# Patient Record
Sex: Female | Born: 1975 | Race: White | Hispanic: No | Marital: Married | Smoking: Former smoker
Health system: Southern US, Community
[De-identification: ages and names within clinical notes are randomized; demographics above are authoritative.]

## PROBLEM LIST (undated history)

## (undated) DIAGNOSIS — I10 Essential (primary) hypertension: Secondary | ICD-10-CM

## (undated) DIAGNOSIS — K2100 Gastro-esophageal reflux disease with esophagitis, without bleeding: Secondary | ICD-10-CM

## (undated) DIAGNOSIS — E039 Hypothyroidism, unspecified: Secondary | ICD-10-CM

## (undated) DIAGNOSIS — F32A Depression, unspecified: Secondary | ICD-10-CM

## (undated) DIAGNOSIS — J45909 Unspecified asthma, uncomplicated: Secondary | ICD-10-CM

## (undated) DIAGNOSIS — F419 Anxiety disorder, unspecified: Secondary | ICD-10-CM

## (undated) DIAGNOSIS — E785 Hyperlipidemia, unspecified: Secondary | ICD-10-CM

## (undated) DIAGNOSIS — G43909 Migraine, unspecified, not intractable, without status migrainosus: Secondary | ICD-10-CM

## (undated) HISTORY — DX: Anxiety disorder, unspecified: F41.9

## (undated) HISTORY — PX: CHOLECYSTECTOMY: SHX55

## (undated) HISTORY — DX: Essential (primary) hypertension: I10

## (undated) HISTORY — PX: BREAST EXCISIONAL BIOPSY: SUR124

## (undated) HISTORY — PX: VAGINAL HYSTERECTOMY: SHX2639

## (undated) HISTORY — DX: Gastro-esophageal reflux disease with esophagitis, without bleeding: K21.00

## (undated) HISTORY — PX: LAPAROSCOPIC GASTRIC SLEEVE RESECTION: SHX5895

## (undated) HISTORY — DX: Hyperlipidemia, unspecified: E78.5

## (undated) HISTORY — DX: Hypothyroidism, unspecified: E03.9

## (undated) HISTORY — DX: Depression, unspecified: F32.A

---
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
## 2017-12-08 DIAGNOSIS — F418 Other specified anxiety disorders: Secondary | ICD-10-CM | POA: Insufficient documentation

## 2018-07-21 DIAGNOSIS — F321 Major depressive disorder, single episode, moderate: Secondary | ICD-10-CM | POA: Insufficient documentation

## 2020-02-03 ENCOUNTER — Other Ambulatory Visit: Payer: Self-pay | Admitting: Legal Medicine

## 2020-02-03 DIAGNOSIS — G43709 Chronic migraine without aura, not intractable, without status migrainosus: Secondary | ICD-10-CM

## 2020-02-07 ENCOUNTER — Encounter: Payer: Self-pay | Admitting: Legal Medicine

## 2020-02-07 DIAGNOSIS — E782 Mixed hyperlipidemia: Secondary | ICD-10-CM | POA: Insufficient documentation

## 2020-02-07 DIAGNOSIS — K2100 Gastro-esophageal reflux disease with esophagitis, without bleeding: Secondary | ICD-10-CM | POA: Insufficient documentation

## 2020-02-07 DIAGNOSIS — J4520 Mild intermittent asthma, uncomplicated: Secondary | ICD-10-CM | POA: Insufficient documentation

## 2020-02-07 DIAGNOSIS — G43109 Migraine with aura, not intractable, without status migrainosus: Secondary | ICD-10-CM | POA: Insufficient documentation

## 2020-02-07 DIAGNOSIS — Z9884 Bariatric surgery status: Secondary | ICD-10-CM | POA: Insufficient documentation

## 2020-02-07 DIAGNOSIS — E039 Hypothyroidism, unspecified: Secondary | ICD-10-CM | POA: Insufficient documentation

## 2020-02-07 DIAGNOSIS — I10 Essential (primary) hypertension: Secondary | ICD-10-CM | POA: Insufficient documentation

## 2020-02-08 ENCOUNTER — Ambulatory Visit: Payer: Self-pay | Admitting: Legal Medicine

## 2020-02-14 ENCOUNTER — Other Ambulatory Visit: Payer: Self-pay

## 2020-02-14 ENCOUNTER — Other Ambulatory Visit: Payer: Self-pay | Admitting: Legal Medicine

## 2020-02-14 ENCOUNTER — Encounter: Payer: Self-pay | Admitting: Legal Medicine

## 2020-02-14 ENCOUNTER — Ambulatory Visit (INDEPENDENT_AMBULATORY_CARE_PROVIDER_SITE_OTHER): Payer: Managed Care, Other (non HMO) | Admitting: Legal Medicine

## 2020-02-14 VITALS — BP 140/90 | HR 77 | Temp 97.8°F | Resp 18 | Ht 65.0 in | Wt 353.0 lb

## 2020-02-14 DIAGNOSIS — E782 Mixed hyperlipidemia: Secondary | ICD-10-CM | POA: Diagnosis not present

## 2020-02-14 DIAGNOSIS — E038 Other specified hypothyroidism: Secondary | ICD-10-CM

## 2020-02-14 DIAGNOSIS — K2100 Gastro-esophageal reflux disease with esophagitis, without bleeding: Secondary | ICD-10-CM

## 2020-02-14 DIAGNOSIS — G43709 Chronic migraine without aura, not intractable, without status migrainosus: Secondary | ICD-10-CM

## 2020-02-14 DIAGNOSIS — I10 Essential (primary) hypertension: Secondary | ICD-10-CM | POA: Diagnosis not present

## 2020-02-14 MED ORDER — KETOROLAC TROMETHAMINE 10 MG PO TABS: 10.0000 mg | ORAL_TABLET | Freq: Four times a day (QID) | ORAL | 3 refills | Status: DC | PRN

## 2020-02-14 MED ORDER — RIZATRIPTAN BENZOATE 10 MG PO TABS: 10.0000 mg | ORAL_TABLET | ORAL | 2 refills | Status: DC | PRN

## 2020-02-14 NOTE — Assessment & Plan Note (Signed)

## 2020-02-14 NOTE — Assessment & Plan Note (Signed)
AN INDIVIDUAL CARE PLAN was established and reinforced today.  The patient's status was assessed using clinical findings on exam, lab and other diagnostic tests. The patient's disease status was assessed based on evidence-based guidelines and found to be in good controlled. MEDICATIONS were reviewed. SELF MANAGEMENT GOALS have been discussed and patient's success at attaining the goal of low cholesterol was assessed. RECOMMENDATION given include regular exercise 3 days a week and low cholesterol/low fat diet. CLINICAL SUMMARY including written plan to identify barriers unique to the patient due to social or economic  reasons was discussed.

## 2020-02-14 NOTE — Assessment & Plan Note (Signed)
AN INDIVIDUAL CARE PLAN was established and reinforced today.  The patient's status was assessed using clinical findings on exam, labs, and other diagnostic testing. Patient's success at meeting treatment goals based on disease specific evidence-bassed guidelines and found to be in good control. RECOMMENDATIONS include maintaining present medicines and treatment. 

## 2020-02-14 NOTE — Assessment & Plan Note (Signed)
An individualize plan was formulated using patient history and physical exam to encourage weight loss.  An evidence based program was formulated.  Patient is to cut portion size with meals and to plan physical exercise 3 days a week at least 20 minutes.  Weight watchers and other programs are helpful.  Planned amount of weight loss 10 lbs. By next visit

## 2020-02-14 NOTE — Progress Notes (Signed)
Established Patient Office Visit  Subjective:  Patient ID: Erin Henderson, female    DOB: 1976-10-25  Age: 44 y.o. MRN: [id number]  CC:  Chief Complaint  Patient presents with  . Hypothyroidism  . Hyperlipidemia  . Hypertension  . Gastroesophageal Reflux    HPI Erin Henderson presents for Chronic visit.  Patient has hypothyroidism and is n levothyroxine 16mg.  No problems.  Patient presents for follow up of hypertension.  Patient tolerating losartan, propranolol well with side effects.  Patient was diagnosed with hypertension 2010 so has been treated for hypertension for 10 years.Patient is working on maintaining diet and exercise regimen and follows up as directed. Complication include none.  Patient presents with hyperlipidemia.  Compliance with treatment has been good; patient takes medicines as directed, maintains low cholesterol diet, follows up as directed, and maintains exercise regimen.  Patient is using pravastatin without problems.  Patient has gastroesophageal reflux symptoms withesophagitis and LTRD.  The symptoms are moderate intensity.  Length of symptoms 10 years.  Medicines include omeprazole.  Complications include none.  Past Medical History:  Diagnosis Date  . Asthma   . Hyperlipidemia   . Hypertension   . Hypothyroidism   . Migraines     Past Surgical History:  Procedure Laterality Date  . CHOLECYSTECTOMY    . LAPAROSCOPIC GASTRIC SLEEVE RESECTION    . VAGINAL HYSTERECTOMY      Family History  Problem Relation Age of Onset  . Hypertension Mother   . Hyperlipidemia Mother   . Thyroid disease Mother   . Migraines Mother   . Asthma Father   . Stroke Father   . Diabetes Father   . Hyperlipidemia Father   . Hypertension Father   . Thyroid disease Father   . Hyperlipidemia Sister   . Hypertension Sister     Social History   Socioeconomic History  . Marital status: Married    Spouse name: Not on file  . Number of children: 2  . Years of  education: Not on file  . Highest education level: Not on file  Occupational History  . Occupation: school bus driver  Tobacco Use  . Smoking status: Former Smoker    Types: Cigarettes    Quit date: 2000    Years since quitting: 21.1  . Smokeless tobacco: Never Used  Substance and Sexual Activity  . Alcohol use: Never  . Drug use: Never  . Sexual activity: Not on file  Other Topics Concern  . Not on file  Social History Narrative  . Not on file   Social Determinants of Health   Financial Resource Strain:   . Difficulty of Paying Living Expenses: Not on file  Food Insecurity:   . Worried About RCharity fundraiserin the Last Year: Not on file  . Ran Out of Food in the Last Year: Not on file  Transportation Needs:   . Lack of Transportation (Medical): Not on file  . Lack of Transportation (Non-Medical): Not on file  Physical Activity:   . Days of Exercise per Week: Not on file  . Minutes of Exercise per Session: Not on file  Stress:   . Feeling of Stress : Not on file  Social Connections:   . Frequency of Communication with Friends and Family: Not on file  . Frequency of Social Gatherings with Friends and Family: Not on file  . Attends Religious Services: Not on file  . Active Member of Clubs or Organizations: Not on file  .  Attends Archivist Meetings: Not on file  . Marital Status: Not on file  Intimate Partner Violence:   . Fear of Current or Ex-Partner: Not on file  . Emotionally Abused: Not on file  . Physically Abused: Not on file  . Sexually Abused: Not on file    Outpatient Medications Prior to Visit  Medication Sig Dispense Refill  . ARIPiprazole (ABILIFY) 10 MG tablet Take 10 mg by mouth daily.    Marland Kitchen levothyroxine (SYNTHROID) 100 MCG tablet Take 100 mcg by mouth daily before breakfast.    . losartan (COZAAR) 100 MG tablet Take 100 mg by mouth daily.    Marland Kitchen omeprazole (PRILOSEC) 40 MG capsule Take 40 mg by mouth daily.    . OXcarbazepine (TRILEPTAL)  150 MG tablet Take 300 mg by mouth 2 (two) times daily.    . pravastatin (PRAVACHOL) 80 MG tablet Take 80 mg by mouth daily.    . propranolol (INDERAL) 80 MG tablet Take 80 mg by mouth 3 (three) times daily.    . sertraline (ZOLOFT) 100 MG tablet Take 100 mg by mouth daily.    Marland Kitchen ketorolac (TORADOL) 10 MG tablet Take 10 mg by mouth every 6 (six) hours as needed.    . rizatriptan (MAXALT) 10 MG tablet Take 1 tablet (10 mg total) by mouth as needed for migraine. 30 tablet 2  . ketorolac (TORADOL) 10 MG tablet Take 1 tablet (10 mg total) by mouth every 8 (eight) hours. 30 tablet 2  . rizatriptan (MAXALT) 10 MG tablet Take 10 mg by mouth as needed. May repeat in 2 hours if needed     No facility-administered medications prior to visit.    Allergies  Allergen Reactions  . Cefdinir Hives and Nausea And Vomiting  . Risperidone Other (See Comments)    unknown    ROS Review of Systems  Constitutional: Negative.   HENT: Negative.   Eyes: Negative.   Respiratory: Negative.   Cardiovascular: Negative.   Gastrointestinal: Negative.   Endocrine: Negative.   Genitourinary: Negative.   Musculoskeletal: Negative.   Skin: Negative.   Neurological: Negative.       Objective:    Physical Exam  Constitutional: She is oriented to person, place, and time. She appears well-developed and well-nourished.  HENT:  Head: Normocephalic and atraumatic.  Eyes: Pupils are equal, round, and reactive to light. Conjunctivae and EOM are normal.  Cardiovascular: Normal heart sounds.  Pulmonary/Chest: Effort normal and breath sounds normal.  Abdominal: Soft. Bowel sounds are normal.  Musculoskeletal:        General: Normal range of motion.     Cervical back: Normal range of motion and neck supple.  Neurological: She is alert and oriented to person, place, and time. She has normal reflexes.  Skin: Skin is warm and dry.  Psychiatric: She has a normal mood and affect.  Vitals reviewed.   BP 140/90    Pulse 77   Temp 97.8 F (36.6 C)   Resp 18   Ht 5' 5"  (1.651 m)   Wt (!) 353 lb (160.1 kg)   SpO2 96%   BMI 58.74 kg/m  Wt Readings from Last 3 Encounters:  02/14/20 (!) 353 lb (160.1 kg)     Health Maintenance Due  Topic Date Due  . HIV Screening  09/23/1989  . TETANUS/TDAP  09/23/1993  . PAP SMEAR-Modifier  09/24/1995  . INFLUENZA VACCINE  07/22/2019    There are no preventive care reminders to display for this patient.  No results found for: TSH No results found for: WBC, HGB, HCT, MCV, PLT No results found for: NA, K, CHLORIDE, CO2, GLUCOSE, BUN, CREATININE, BILITOT, ALKPHOS, AST, ALT, PROT, ALBUMIN, CALCIUM, ANIONGAP, EGFR, GFR No results found for: CHOL No results found for: HDL No results found for: LDLCALC No results found for: TRIG No results found for: CHOLHDL No results found for: HGBA1C    Assessment & Plan:   Problem List Items Addressed This Visit      Cardiovascular and Mediastinum   Essential hypertension    An individual care plan was established and reinforced today.  The patient's status was assessed using clinical findings on exam and labs or diagnostic tests. The patient's success at meeting treatment goals on disease specific evidence-based guidelines and found to be well controlled. SELF MANAGEMENT: The patient and I together assessed ways to personally work towards obtaining the recommended goals. RECOMMENDATIONS: avoid decongestants found in common cold remedies, decrease consumption of alcohol, perform routine monitoring of BP with home BP cuff, exercise, reduction of dietary salt, take medicines as prescribed, try not to miss doses and quit smoking.  Regular exercise and maintaining a healthy weight is needed.  Stress reduction may help. A CLINICAL SUMMARY including written plan identify barriers to care unique to individual due to social or financial issues.  We attempt to mutually creat solutions for individual and family understanding.       Relevant Orders   CBC with Differential   Comprehensive metabolic panel     Digestive   GERD with esophagitis    AN INDIVIDUAL CARE PLAN was established and reinforced today.  The patient's status was assessed using clinical findings on exam, labs, and other diagnostic testing. Patient's success at meeting treatment goals based on disease specific evidence-bassed guidelines and found to be in good control. RECOMMENDATIONS include maintaining present medicines and treatment.        Endocrine   Hypothyroidism - Primary    AN INDIVIDUAL CARE PLAN was established and reinforced today.  The patient's status was assessed using clinical findings on exam, labs, and other diagnostic testing. Patient's success at meeting treatment goals based on disease specific evidence-bassed guidelines and found to be in good control. RECOMMENDATIONS include maintaining present medicines and treatment.      Relevant Orders   TSH     Other   Mixed hyperlipidemia    AN INDIVIDUAL CARE PLAN was established and reinforced today.  The patient's status was assessed using clinical findings on exam, lab and other diagnostic tests. The patient's disease status was assessed based on evidence-based guidelines and found to be in good controlled. MEDICATIONS were reviewed. SELF MANAGEMENT GOALS have been discussed and patient's success at attaining the goal of low cholesterol was assessed. RECOMMENDATION given include regular exercise 3 days a week and low cholesterol/low fat diet. CLINICAL SUMMARY including written plan to identify barriers unique to the patient due to social or economic  reasons was discussed.      Relevant Orders   Lipid Panel   Morbid obesity ([place])    An individualize plan was formulated using patient history and physical exam to encourage weight loss.  An evidence based program was formulated.  Patient is to cut portion size with meals and to plan physical exercise 3 days a week at least 20 minutes.   Weight watchers and other programs are helpful.  Planned amount of weight loss 10 lbs. By next visit       Other Visit Diagnoses  Chronic migraine without aura without status migrainosus, not intractable       Relevant Medications   ketorolac (TORADOL) 10 MG tablet      Meds ordered this encounter  Medications  . DISCONTD: rizatriptan (MAXALT) 10 MG tablet    Sig: Take 1 tablet (10 mg total) by mouth as needed for migraine.    Dispense:  30 tablet    Refill:  2  . ketorolac (TORADOL) 10 MG tablet    Sig: Take 1 tablet (10 mg total) by mouth every 6 (six) hours as needed.    Dispense:  20 tablet    Refill:  3    Follow-up: Return in about 4 months (around 06/13/2020) for fasting.    Reinaldo Meeker, MD

## 2020-02-15 LAB — COMPREHENSIVE METABOLIC PANEL
ALT: 39 IU/L — ABNORMAL HIGH (ref 0–32)
AST: 22 IU/L (ref 0–40)
Albumin/Globulin Ratio: 2 (ref 1.2–2.2)
Albumin: 4.3 g/dL (ref 3.8–4.8)
Alkaline Phosphatase: 149 IU/L — ABNORMAL HIGH (ref 39–117)
BUN/Creatinine Ratio: 13 (ref 9–23)
BUN: 10 mg/dL (ref 6–24)
Bilirubin Total: 0.3 mg/dL (ref 0.0–1.2)
CO2: 24 mmol/L (ref 20–29)
Calcium: 9.1 mg/dL (ref 8.7–10.2)
Chloride: 104 mmol/L (ref 96–106)
Creatinine, Ser: 0.79 mg/dL (ref 0.57–1.00)
GFR calc Af Amer: 105 mL/min/{1.73_m2} (ref >59)
GFR calc non Af Amer: 91 mL/min/{1.73_m2} (ref >59)
Globulin, Total: 2.1 g/dL (ref 1.5–4.5)
Glucose: 85 mg/dL (ref 65–99)
Potassium: 3.9 mmol/L (ref 3.5–5.2)
Sodium: 143 mmol/L (ref 134–144)
Total Protein: 6.4 g/dL (ref 6.0–8.5)

## 2020-02-15 LAB — TSH: TSH: 1.49 u[IU]/mL (ref 0.450–4.500)

## 2020-02-15 LAB — CBC WITH DIFFERENTIAL/PLATELET
Basophils Absolute: 0 10*3/uL (ref 0.0–0.2)
Basos: 0 %
EOS (ABSOLUTE): 0.1 10*3/uL (ref 0.0–0.4)
Eos: 2 %
Hematocrit: 38 % (ref 34.0–46.6)
Hemoglobin: 12.6 g/dL (ref 11.1–15.9)
Immature Grans (Abs): 0 10*3/uL (ref 0.0–0.1)
Immature Granulocytes: 0 %
Lymphocytes Absolute: 2.9 10*3/uL (ref 0.7–3.1)
Lymphs: 36 %
MCH: 26.3 pg — ABNORMAL LOW (ref 26.6–33.0)
MCHC: 33.2 g/dL (ref 31.5–35.7)
MCV: 79 fL (ref 79–97)
Monocytes Absolute: 0.5 10*3/uL (ref 0.1–0.9)
Monocytes: 6 %
Neutrophils Absolute: 4.6 10*3/uL (ref 1.4–7.0)
Neutrophils: 56 %
Platelets: 216 10*3/uL (ref 150–450)
RBC: 4.79 x10E6/uL (ref 3.77–5.28)
RDW: 14.4 % (ref 11.7–15.4)
WBC: 8.1 10*3/uL (ref 3.4–10.8)

## 2020-02-15 LAB — LIPID PANEL
Chol/HDL Ratio: 5.1 ratio — ABNORMAL HIGH (ref 0.0–4.4)
Cholesterol, Total: 178 mg/dL (ref 100–199)
HDL: 35 mg/dL — ABNORMAL LOW (ref >39)
LDL Chol Calc (NIH): 96 mg/dL (ref 0–99)
Triglycerides: 278 mg/dL — ABNORMAL HIGH (ref 0–149)
VLDL Cholesterol Cal: 47 mg/dL — ABNORMAL HIGH (ref 5–40)

## 2020-02-15 LAB — CARDIOVASCULAR RISK ASSESSMENT

## 2020-02-15 NOTE — Progress Notes (Signed)
No anemia, no kidney problems, liver tests slightly up, Triglycerides high, LDL normal  need to watch diet. lp

## 2020-02-19 ENCOUNTER — Encounter: Payer: Self-pay | Admitting: Legal Medicine

## 2020-03-07 ENCOUNTER — Other Ambulatory Visit: Payer: Self-pay | Admitting: Legal Medicine

## 2020-05-24 ENCOUNTER — Other Ambulatory Visit: Payer: Self-pay | Admitting: Legal Medicine

## 2020-06-01 ENCOUNTER — Other Ambulatory Visit: Payer: Self-pay | Admitting: Legal Medicine

## 2020-07-18 ENCOUNTER — Other Ambulatory Visit: Payer: Self-pay | Admitting: Legal Medicine

## 2020-07-18 DIAGNOSIS — I10 Essential (primary) hypertension: Secondary | ICD-10-CM

## 2020-07-18 DIAGNOSIS — E782 Mixed hyperlipidemia: Secondary | ICD-10-CM

## 2020-08-14 ENCOUNTER — Encounter: Payer: Self-pay | Admitting: Legal Medicine

## 2020-08-14 ENCOUNTER — Ambulatory Visit: Payer: Self-pay | Admitting: Legal Medicine

## 2020-08-14 DIAGNOSIS — E785 Hyperlipidemia, unspecified: Secondary | ICD-10-CM | POA: Insufficient documentation

## 2020-09-02 ENCOUNTER — Encounter: Payer: Self-pay | Admitting: Legal Medicine

## 2020-09-02 ENCOUNTER — Other Ambulatory Visit: Payer: Self-pay

## 2020-09-02 ENCOUNTER — Ambulatory Visit (INDEPENDENT_AMBULATORY_CARE_PROVIDER_SITE_OTHER): Payer: Managed Care, Other (non HMO) | Admitting: Legal Medicine

## 2020-09-02 VITALS — BP 124/80 | HR 74 | Temp 97.4°F | Resp 17 | Ht 65.0 in | Wt 360.0 lb

## 2020-09-02 DIAGNOSIS — E038 Other specified hypothyroidism: Secondary | ICD-10-CM | POA: Diagnosis not present

## 2020-09-02 DIAGNOSIS — I10 Essential (primary) hypertension: Secondary | ICD-10-CM

## 2020-09-02 DIAGNOSIS — E782 Mixed hyperlipidemia: Secondary | ICD-10-CM

## 2020-09-02 DIAGNOSIS — K2100 Gastro-esophageal reflux disease with esophagitis, without bleeding: Secondary | ICD-10-CM | POA: Diagnosis not present

## 2020-09-02 NOTE — Progress Notes (Signed)
Subjective:  Patient ID: Erin Henderson, female    DOB: June 01, 1976  Age: 44 y.o. MRN: [id number]  Chief Complaint  Patient presents with  . Hypothyroidism  . Hypertension    HPI: chronic visit  Patient presents for follow up of hypertension.  Patient tolerating losartan well with side effects.  Patient was diagnosed with hypertension 2010 so has been treated for hypertension for 10 years.Patient is working on maintaining diet and exercise regimen and follows up as directed. Complication include none  Patient has HYPOTHYROIDISM.  Diagnosed 10 years ago.  Patient has stable thyroid readings.  Patient is having none.  Last TSH was normal.  continue dosage of thyroid medicine..   This patient has major depression for years.  PHQ9 =5.  Patient is having less anhedonia.  The patient has improved future plans and prospects.  The depression is worse with stress.  The patient is not exercising and working on behavior to improve mental health.  Patient is nt seeing a therapist or psychiatrist.  na  Patient is on fluoxetine aripirazole. Current Outpatient Medications on File Prior to Visit  Medication Sig Dispense Refill  . ARIPiprazole (ABILIFY) 10 MG tablet Take 10 mg by mouth daily.    Marland Kitchen ketorolac (TORADOL) 10 MG tablet Take 1 tablet (10 mg total) by mouth every 6 (six) hours as needed. 20 tablet 3  . levothyroxine (SYNTHROID) 100 MCG tablet TAKE ONE TABLET BY MOUTH EVERY DAY 30 tablet 6  . losartan (COZAAR) 100 MG tablet TAKE ONE TABLET BY MOUTH EVERY DAY 90 tablet 2  . omeprazole (PRILOSEC) 40 MG capsule TAKE 1 CAPSULE BY MOUTH EVERY DAY 30 capsule 6  . OXcarbazepine (TRILEPTAL) 150 MG tablet Take 300 mg by mouth 2 (two) times daily.    . Oxcarbazepine (TRILEPTAL) 300 MG tablet Take 300 mg by mouth 2 (two) times daily.    . pravastatin (PRAVACHOL) 80 MG tablet Take 80 mg by mouth daily.    . propranolol (INDERAL) 80 MG tablet TAKE ONE TABLET BY MOUTH EVERY DAY 30 tablet 6  . rizatriptan  (MAXALT) 10 MG tablet TAKE ONE TABLET BY MOUTH AT ONSET OF HEADACHE. MAY REPEAT IN TWO HOURS IF NEEDED. 30 tablet 2  . sertraline (ZOLOFT) 100 MG tablet Take 100 mg by mouth daily.     No current facility-administered medications on file prior to visit.   Past Medical History:  Diagnosis Date  . GERD with esophagitis   . Hyperlipidemia   . Hypertension   . Hypothyroidism    Past Surgical History:  Procedure Laterality Date  . CHOLECYSTECTOMY    . LAPAROSCOPIC GASTRIC SLEEVE RESECTION    . VAGINAL HYSTERECTOMY      Family History  Problem Relation Age of Onset  . Hypertension Mother   . Hyperlipidemia Mother   . Thyroid disease Mother   . Migraines Mother   . Asthma Father   . Stroke Father   . Diabetes Father   . Hyperlipidemia Father   . Hypertension Father   . Thyroid disease Father   . Hyperlipidemia Sister   . Hypertension Sister    Social History   Socioeconomic History  . Marital status: Married    Spouse name: Not on file  . Number of children: 2  . Years of education: Not on file  . Highest education level: Not on file  Occupational History  . Occupation: school bus driver  Tobacco Use  . Smoking status: Former Smoker    Types: Cigarettes  Quit date: 2000    Years since quitting: 21.7  . Smokeless tobacco: Never Used  Vaping Use  . Vaping Use: Never used  Substance and Sexual Activity  . Alcohol use: Never  . Drug use: Never  . Sexual activity: Not on file  Other Topics Concern  . Not on file  Social History Narrative  . Not on file   Social Determinants of Health   Financial Resource Strain:   . Difficulty of Paying Living Expenses: Not on file  Food Insecurity:   . Worried About Programme researcher, broadcasting/film/video in the Last Year: Not on file  . Ran Out of Food in the Last Year: Not on file  Transportation Needs:   . Lack of Transportation (Medical): Not on file  . Lack of Transportation (Non-Medical): Not on file  Physical Activity:   . Days of  Exercise per Week: Not on file  . Minutes of Exercise per Session: Not on file  Stress:   . Feeling of Stress : Not on file  Social Connections:   . Frequency of Communication with Friends and Family: Not on file  . Frequency of Social Gatherings with Friends and Family: Not on file  . Attends Religious Services: Not on file  . Active Member of Clubs or Organizations: Not on file  . Attends Banker Meetings: Not on file  . Marital Status: Not on file    Review of Systems  Constitutional: Negative.   HENT: Negative.   Eyes: Negative.   Respiratory: Negative for cough and shortness of breath.   Cardiovascular: Negative for chest pain, palpitations and leg swelling.  Gastrointestinal: Negative.   Endocrine: Negative.   Genitourinary: Negative.   Musculoskeletal: Negative.   Skin: Negative.   Neurological: Negative.   Psychiatric/Behavioral: Positive for behavioral problems.     Objective:  BP 124/80   Pulse 74   Temp (!) 97.4 F (36.3 C)   Resp 17   Ht 5\' 5"  (1.651 m)   Wt (!) 360 lb (163.3 kg)   BMI 59.91 kg/m   BP/Weight 09/02/2020 02/14/2020  Systolic BP 124 140  Diastolic BP 80 90  Wt. (Lbs) 360 353  BMI 59.91 58.74    Physical Exam Vitals reviewed.  Constitutional:      Appearance: Normal appearance.  HENT:     Head: Normocephalic and atraumatic.     Right Ear: Tympanic membrane, ear canal and external ear normal.     Left Ear: Tympanic membrane, ear canal and external ear normal.     Mouth/Throat:     Mouth: Mucous membranes are moist.     Pharynx: Oropharynx is clear.  Eyes:     Extraocular Movements: Extraocular movements intact.     Conjunctiva/sclera: Conjunctivae normal.     Pupils: Pupils are equal, round, and reactive to light.  Cardiovascular:     Rate and Rhythm: Normal rate and regular rhythm.     Pulses: Normal pulses.     Heart sounds: Normal heart sounds.  Pulmonary:     Effort: Pulmonary effort is normal.     Breath  sounds: Normal breath sounds.  Abdominal:     General: Abdomen is flat. Bowel sounds are normal.     Palpations: Abdomen is soft.  Musculoskeletal:        General: Normal range of motion.     Cervical back: Normal range of motion and neck supple.  Skin:    General: Skin is dry.  Capillary Refill: Capillary refill takes less than 2 seconds.  Neurological:     General: No focal deficit present.     Mental Status: She is alert and oriented to person, place, and time.  Psychiatric:        Mood and Affect: Mood normal.        Thought Content: Thought content normal.        Judgment: Judgment normal.       Lab Results  Component Value Date   WBC 8.1 02/14/2020   HGB 12.6 02/14/2020   HCT 38.0 02/14/2020   PLT 216 02/14/2020   GLUCOSE 85 02/14/2020   CHOL 178 02/14/2020   TRIG 278 (H) 02/14/2020   HDL 35 (L) 02/14/2020   LDLCALC 96 02/14/2020   ALT 39 (H) 02/14/2020   AST 22 02/14/2020   NA 143 02/14/2020   K 3.9 02/14/2020   CL 104 02/14/2020   CREATININE 0.79 02/14/2020   BUN 10 02/14/2020   CO2 24 02/14/2020   TSH 1.490 02/14/2020      Assessment & Plan:   1. Other specified hypothyroidism Patient is known to have hypothyroidism and is 0n treatment with levothyroxine .  Patient was diagnosed 10 years ago.  Other treatment includes none.  Patient is compliant with medicines and last TSH 6 months ago.  Last TSH was normal.  2. Mixed hyperlipidemia - Lipid panel AN INDIVIDUAL CARE PLAN for hyperlipidemia/ cholesterol was established and reinforced today.  The patient's status was assessed using clinical findings on exam, lab and other diagnostic tests. The patient's disease status was assessed based on evidence-based guidelines and found to be well controlled. MEDICATIONS were reviewed. SELF MANAGEMENT GOALS have been discussed and patient's success at attaining the goal of low cholesterol was assessed. RECOMMENDATION given include regular exercise 3 days a  week and low cholesterol/low fat diet. CLINICAL SUMMARY including written plan to identify barriers unique to the patient due to social or economic  reasons was discussed.  3. Essential hypertension - CBC with Differential/Platelet - Comprehensive metabolic panel An individual hypertension care plan was established and reinforced today.  The patient's status was assessed using clinical findings on exam and labs or diagnostic tests. The patient's success at meeting treatment goals on disease specific evidence-based guidelines and found to be well controlled. SELF MANAGEMENT: The patient and I together assessed ways to personally work towards obtaining the recommended goals. RECOMMENDATIONS: avoid decongestants found in common cold remedies, decrease consumption of alcohol, perform routine monitoring of BP with home BP cuff, exercise, reduction of dietary salt, take medicines as prescribed, try not to miss doses and quit smoking.  Regular exercise and maintaining a healthy weight is needed.  Stress reduction may help. A CLINICAL SUMMARY including written plan identify barriers to care unique to individual due to social or financial issues.  We attempt to mutually creat solutions for individual and family understanding.  4. Gastroesophageal reflux disease with esophagitis without hemorrhage Plan of care was formulated today.  She is doing well.  A plan of care was formulated using patient exam, tests and other sources to optimize care using evidence based information.  Recommend no smoking, no eating after supper, avoid fatty foods, elevate Head of bed, avoid tight fitting clothing.  Continue on omeprzole.  5. Morbid obesity ([facility]) An individualize plan was formulated for obesity using patient history and physical exam to encourage weight loss.  An evidence based program was formulated.  Patient is to cut portion size with meals  and to plan physical exercise 3 days a week at least 20 minutes.  Weight  watchers and other programs are helpful.  Planned amount of weight loss 10 lbs. She has failed bariatric surgery.    No orders of the defined types were placed in this encounter.   Orders Placed This Encounter  Procedures  . CBC with Differential/Platelet  . Comprehensive metabolic panel  . Lipid panel     Follow-up: Return in about 6 months (around 03/02/2021) for fasting.  An After Visit Summary was printed and given to the patient.  Brent BullaLawrence Davarius [facility] [phone]

## 2020-09-03 ENCOUNTER — Other Ambulatory Visit: Payer: Self-pay

## 2020-09-03 DIAGNOSIS — R7989 Other specified abnormal findings of blood chemistry: Secondary | ICD-10-CM

## 2020-09-03 DIAGNOSIS — R945 Abnormal results of liver function studies: Secondary | ICD-10-CM

## 2020-09-03 LAB — CBC WITH DIFFERENTIAL/PLATELET
Basophils Absolute: 0.1 10*3/uL (ref 0.0–0.2)
Basos: 1 %
EOS (ABSOLUTE): 0.1 10*3/uL (ref 0.0–0.4)
Eos: 1 %
Hematocrit: 40.5 % (ref 34.0–46.6)
Hemoglobin: 12.6 g/dL (ref 11.1–15.9)
Immature Grans (Abs): 0.1 10*3/uL (ref 0.0–0.1)
Immature Granulocytes: 1 %
Lymphocytes Absolute: 3 10*3/uL (ref 0.7–3.1)
Lymphs: 28 %
MCH: 25.1 pg — ABNORMAL LOW (ref 26.6–33.0)
MCHC: 31.1 g/dL — ABNORMAL LOW (ref 31.5–35.7)
MCV: 81 fL (ref 79–97)
Monocytes Absolute: 0.6 10*3/uL (ref 0.1–0.9)
Monocytes: 6 %
Neutrophils Absolute: 6.7 10*3/uL (ref 1.4–7.0)
Neutrophils: 63 %
Platelets: 249 10*3/uL (ref 150–450)
RBC: 5.01 x10E6/uL (ref 3.77–5.28)
RDW: 14.3 % (ref 11.7–15.4)
WBC: 10.5 10*3/uL (ref 3.4–10.8)

## 2020-09-03 LAB — COMPREHENSIVE METABOLIC PANEL
ALT: 36 IU/L — ABNORMAL HIGH (ref 0–32)
AST: 18 IU/L (ref 0–40)
Albumin/Globulin Ratio: 1.8 (ref 1.2–2.2)
Albumin: 4.3 g/dL (ref 3.8–4.8)
Alkaline Phosphatase: 161 IU/L — ABNORMAL HIGH (ref 44–121)
BUN/Creatinine Ratio: 11 (ref 9–23)
BUN: 10 mg/dL (ref 6–24)
Bilirubin Total: 0.4 mg/dL (ref 0.0–1.2)
CO2: 25 mmol/L (ref 20–29)
Calcium: 9.3 mg/dL (ref 8.7–10.2)
Chloride: 101 mmol/L (ref 96–106)
Creatinine, Ser: 0.91 mg/dL (ref 0.57–1.00)
GFR calc Af Amer: 88 mL/min/{1.73_m2} (ref >59)
GFR calc non Af Amer: 76 mL/min/{1.73_m2} (ref >59)
Globulin, Total: 2.4 g/dL (ref 1.5–4.5)
Glucose: 88 mg/dL (ref 65–99)
Potassium: 4.1 mmol/L (ref 3.5–5.2)
Sodium: 141 mmol/L (ref 134–144)
Total Protein: 6.7 g/dL (ref 6.0–8.5)

## 2020-09-03 LAB — LIPID PANEL
Chol/HDL Ratio: 5.5 ratio — ABNORMAL HIGH (ref 0.0–4.4)
Cholesterol, Total: 170 mg/dL (ref 100–199)
HDL: 31 mg/dL — ABNORMAL LOW (ref >39)
LDL Chol Calc (NIH): 90 mg/dL (ref 0–99)
Triglycerides: 291 mg/dL — ABNORMAL HIGH (ref 0–149)
VLDL Cholesterol Cal: 49 mg/dL — ABNORMAL HIGH (ref 5–40)

## 2020-09-03 LAB — CARDIOVASCULAR RISK ASSESSMENT

## 2020-09-03 NOTE — Progress Notes (Signed)
CBC normal, kidney tests normal, one liver test slightly up 36 normal 32, all hepatitis panel to be certain, triglycerides high, watch diet lp

## 2020-10-04 ENCOUNTER — Other Ambulatory Visit: Payer: Self-pay | Admitting: Legal Medicine

## 2020-10-04 MED ORDER — LEVOTHYROXINE SODIUM 100 MCG PO TABS
100.0000 ug | ORAL_TABLET | Freq: Every day | ORAL | 6 refills | Status: DC
Start: 2020-10-04 — End: 2021-05-02

## 2020-11-19 ENCOUNTER — Telehealth (INDEPENDENT_AMBULATORY_CARE_PROVIDER_SITE_OTHER): Payer: BC Managed Care – PPO | Admitting: Legal Medicine

## 2020-11-19 ENCOUNTER — Encounter: Payer: Self-pay | Admitting: Legal Medicine

## 2020-11-19 VITALS — Ht 65.0 in | Wt 360.0 lb

## 2020-11-19 DIAGNOSIS — J329 Chronic sinusitis, unspecified: Secondary | ICD-10-CM | POA: Insufficient documentation

## 2020-11-19 DIAGNOSIS — Z20822 Contact with and (suspected) exposure to covid-19: Secondary | ICD-10-CM

## 2020-11-19 DIAGNOSIS — M791 Myalgia, unspecified site: Secondary | ICD-10-CM

## 2020-11-19 DIAGNOSIS — Z9189 Other specified personal risk factors, not elsewhere classified: Secondary | ICD-10-CM | POA: Diagnosis not present

## 2020-11-19 DIAGNOSIS — J019 Acute sinusitis, unspecified: Secondary | ICD-10-CM | POA: Diagnosis not present

## 2020-11-19 LAB — POC COVID19 BINAXNOW: SARS Coronavirus 2 Ag: NEGATIVE

## 2020-11-19 LAB — POCT INFLUENZA A/B
Influenza A, POC: NEGATIVE
Influenza B, POC: NEGATIVE

## 2020-11-19 MED ORDER — PREDNISONE 10 MG (21) PO TBPK: ORAL_TABLET | ORAL | 0 refills | Status: DC

## 2020-11-19 MED ORDER — AMOXICILLIN-POT CLAVULANATE 875-125 MG PO TABS: 1.0000 | ORAL_TABLET | Freq: Two times a day (BID) | ORAL | 0 refills | Status: DC

## 2020-11-19 NOTE — Progress Notes (Signed)
Virtual Visit via Telephone Note   This visit type was conducted due to national recommendations for restrictions regarding the COVID-19 Pandemic (e.g. social distancing) in an effort to limit this patient's exposure and mitigate transmission in our community.  Due to her co-morbid illnesses, this patient is at least at moderate risk for complications without adequate follow up.  This format is felt to be most appropriate for this patient at this time.  The patient did not have access to video technology/had technical difficulties with video requiring transitioning to audio format only (telephone).  All issues noted in this document were discussed and addressed.  No physical exam could be performed with this format.  Patient verbally consented to a telehealth visit.   Date:  11/19/2020   ID:  Erin Henderson, DOB October 17, 1976, MRN [id number]  Patient Location: Home Provider Location: Home Office  PCP:  Abigail Miyamoto, MD   Evaluation Performed:  New Patient Evaluation  Chief Complaint:  Cough and congestion for 3 days, coughing up phlegm no fever 101.5, she is having myalgias.  Flu-like myalgias  History of Present Illness:    Erin Henderson is a 44 y.o. female with Cough and congestion for 3 days, coughing up phlegm no fever 101.5, negative covid, diffuse myalgias   The patient does have symptoms concerning for COVID-19 infection (fever, chills, cough, or new shortness of breath).    Past Medical History:  Diagnosis Date  . GERD with esophagitis   . Hyperlipidemia   . Hypertension   . Hypothyroidism     Past Surgical History:  Procedure Laterality Date  . CHOLECYSTECTOMY    . LAPAROSCOPIC GASTRIC SLEEVE RESECTION    . VAGINAL HYSTERECTOMY      Family History  Problem Relation Age of Onset  . Hypertension Mother   . Hyperlipidemia Mother   . Thyroid disease Mother   . Migraines Mother   . Asthma Father   . Stroke Father   . Diabetes Father   . Hyperlipidemia  Father   . Hypertension Father   . Thyroid disease Father   . Hyperlipidemia Sister   . Hypertension Sister     Social History   Socioeconomic History  . Marital status: Married    Spouse name: Not on file  . Number of children: 2  . Years of education: Not on file  . Highest education level: Not on file  Occupational History  . Occupation: school bus driver  Tobacco Use  . Smoking status: Former Smoker    Types: Cigarettes    Quit date: 2000    Years since quitting: 21.9  . Smokeless tobacco: Never Used  Vaping Use  . Vaping Use: Never used  Substance and Sexual Activity  . Alcohol use: Never  . Drug use: Never  . Sexual activity: Not on file  Other Topics Concern  . Not on file  Social History Narrative  . Not on file   Social Determinants of Health   Financial Resource Strain:   . Difficulty of Paying Living Expenses: Not on file  Food Insecurity:   . Worried About Programme researcher, broadcasting/film/video in the Last Year: Not on file  . Ran Out of Food in the Last Year: Not on file  Transportation Needs:   . Lack of Transportation (Medical): Not on file  . Lack of Transportation (Non-Medical): Not on file  Physical Activity:   . Days of Exercise per Week: Not on file  . Minutes of Exercise per Session: Not  on file  Stress:   . Feeling of Stress : Not on file  Social Connections:   . Frequency of Communication with Friends and Family: Not on file  . Frequency of Social Gatherings with Friends and Family: Not on file  . Attends Religious Services: Not on file  . Active Member of Clubs or Organizations: Not on file  . Attends Banker Meetings: Not on file  . Marital Status: Not on file  Intimate Partner Violence:   . Fear of Current or Ex-Partner: Not on file  . Emotionally Abused: Not on file  . Physically Abused: Not on file  . Sexually Abused: Not on file    Outpatient Medications Prior to Visit  Medication Sig Dispense Refill  . ARIPiprazole (ABILIFY) 10  MG tablet Take 10 mg by mouth daily.    Marland Kitchen ketorolac (TORADOL) 10 MG tablet Take 1 tablet (10 mg total) by mouth every 6 (six) hours as needed. 20 tablet 3  . levothyroxine (SYNTHROID) 100 MCG tablet Take 1 tablet (100 mcg total) by mouth daily. 30 tablet 6  . losartan (COZAAR) 100 MG tablet TAKE ONE TABLET BY MOUTH EVERY DAY 90 tablet 2  . omeprazole (PRILOSEC) 40 MG capsule TAKE 1 CAPSULE BY MOUTH EVERY DAY 30 capsule 6  . Oxcarbazepine (TRILEPTAL) 300 MG tablet Take 300 mg by mouth 2 (two) times daily.    . propranolol (INDERAL) 80 MG tablet TAKE ONE TABLET BY MOUTH EVERY DAY 30 tablet 6  . rizatriptan (MAXALT) 10 MG tablet TAKE ONE TABLET BY MOUTH AT ONSET OF HEADACHE. MAY REPEAT IN TWO HOURS IF NEEDED. 30 tablet 2  . sertraline (ZOLOFT) 100 MG tablet Take 100 mg by mouth daily.    . OXcarbazepine (TRILEPTAL) 150 MG tablet Take 300 mg by mouth 2 (two) times daily.    . pravastatin (PRAVACHOL) 80 MG tablet Take 80 mg by mouth daily.     No facility-administered medications prior to visit.    Allergies:   Cefdinir and Risperidone   Social History   Tobacco Use  . Smoking status: Former Smoker    Types: Cigarettes    Quit date: 2000    Years since quitting: 21.9  . Smokeless tobacco: Never Used  Vaping Use  . Vaping Use: Never used  Substance Use Topics  . Alcohol use: Never  . Drug use: Never     Review of Systems  Constitutional: Positive for fever. Negative for chills and diaphoresis.  HENT: Positive for congestion, sinus pain and sore throat.   Respiratory: Positive for cough.   Cardiovascular: Negative.  Negative for palpitations and orthopnea.  Gastrointestinal: Negative for heartburn and nausea.  Genitourinary: Negative for dysuria and urgency.  Musculoskeletal: Negative for myalgias and neck pain.  Skin: Negative for itching and rash.  Neurological: Positive for headaches.  Psychiatric/Behavioral: Negative.      Labs/Other Tests and Data Reviewed:    Recent  Labs: 02/14/2020: TSH 1.490 09/02/2020: ALT 36; BUN 10; Creatinine, Ser 0.91; Hemoglobin 12.6; Platelets 249; Potassium 4.1; Sodium 141   Recent Lipid Panel Lab Results  Component Value Date/Time   CHOL 170 09/02/2020 09:12 AM   TRIG 291 (H) 09/02/2020 09:12 AM   HDL 31 (L) 09/02/2020 09:12 AM   CHOLHDL 5.5 (H) 09/02/2020 09:12 AM   LDLCALC 90 09/02/2020 09:12 AM    Wt Readings from Last 3 Encounters:  11/19/20 (!) 360 lb (163.3 kg)  09/02/20 (!) 360 lb (163.3 kg)  02/14/20 (!) 353 lb (  160.1 kg)     Objective:    Vital Signs:  Ht 5\' 5"  (1.651 m)   Wt (!) 360 lb (163.3 kg)   BMI 59.91 kg/m    Physical Exam reviewed  ASSESSMENT & PLAN:   Diagnoses and all orders for this visit: Myalgia -     Influenza A/B Patient is having myalgias and sickness, she is worried about the flu- test negative.  Acute non-recurrent sinusitis, unspecified location -     amoxicillin-clavulanate (AUGMENTIN) 875-125 MG tablet; Take 1 tablet by mouth 2 (two) times daily. -     predniSONE (STERAPRED UNI-PAK 21 TAB) 10 MG (21) TBPK tablet; Take 6ills first day , then 5 pills day 2 and then cut down one pill day until  Patient has sinusitis symptoms, treat for congestion and an antibiotic  At increased risk of exposure to COVID-19 virus -     POC COVID-19 Patient have symptoms consistent with Covid.  Rapid Covid negative in office.      COVID-19 Education: The signs and symptoms of COVID-19 were discussed with the patient and how to seek care for testing (follow up with PCP or arrange E-visit). The importance of social distancing was discussed today.   I spent 20 minutes dedicated to the care of this patient on the date of this encounter to include face-to-face time with the patient, as well as: discussed test results and gave note for work   Follow Up:  In Person prn  Signed,  , MD  11/19/2020 1:32 PM    [facility]

## 2020-12-05 ENCOUNTER — Telehealth (INDEPENDENT_AMBULATORY_CARE_PROVIDER_SITE_OTHER): Payer: BC Managed Care – PPO | Admitting: Legal Medicine

## 2020-12-05 ENCOUNTER — Encounter: Payer: Self-pay | Admitting: Legal Medicine

## 2020-12-05 VITALS — Ht 65.0 in | Wt 360.0 lb

## 2020-12-05 DIAGNOSIS — J019 Acute sinusitis, unspecified: Secondary | ICD-10-CM

## 2020-12-05 DIAGNOSIS — I10 Essential (primary) hypertension: Secondary | ICD-10-CM

## 2020-12-05 DIAGNOSIS — Z20822 Contact with and (suspected) exposure to covid-19: Secondary | ICD-10-CM | POA: Diagnosis not present

## 2020-12-05 DIAGNOSIS — Z9189 Other specified personal risk factors, not elsewhere classified: Secondary | ICD-10-CM | POA: Diagnosis not present

## 2020-12-05 DIAGNOSIS — E782 Mixed hyperlipidemia: Secondary | ICD-10-CM

## 2020-12-05 LAB — POC COVID19 BINAXNOW: SARS Coronavirus 2 Ag: NEGATIVE

## 2020-12-05 MED ORDER — FLUCONAZOLE 150 MG PO TABS: 150.0000 mg | ORAL_TABLET | Freq: Once | ORAL | 3 refills | Status: AC

## 2020-12-05 MED ORDER — LEVOFLOXACIN 500 MG PO TABS: 500.0000 mg | ORAL_TABLET | Freq: Every day | ORAL | 0 refills | Status: DC

## 2020-12-05 NOTE — Progress Notes (Signed)
Virtual Visit via Telephone Note   This visit type was conducted due to national recommendations for restrictions regarding the COVID-19 Pandemic (e.g. social distancing) in an effort to limit this patient's exposure and mitigate transmission in our community.  Due to her co-morbid illnesses, this patient is at least at moderate risk for complications without adequate follow up.  This format is felt to be most appropriate for this patient at this time.  The patient did not have access to video technology/had technical difficulties with video requiring transitioning to audio format only (telephone).  All issues noted in this document were discussed and addressed.  No physical exam could be performed with this format.  Patient verbally consented to a telehealth visit.   Date:  12/05/2020   ID:  Erin Henderson, DOB 1976/03/18, MRN [id number]  Patient Location: Home Provider Location: Office/Clinic  PCP:  Abigail Miyamoto, MD   Evaluation Performed:  Follow-Up Visit  Chief Complaint: exposure to covid  History of Present Illness:    Erin Henderson is a 44 y.o. female with exposed to covid, sick 1 day no fever, had vaccination. Lost taste, some headache.  The patient does have symptoms concerning for COVID-19 infection (fever, chills, cough, or new shortness of breath).    Past Medical History:  Diagnosis Date  . GERD with esophagitis   . Hyperlipidemia   . Hypertension   . Hypothyroidism     Past Surgical History:  Procedure Laterality Date  . CHOLECYSTECTOMY    . LAPAROSCOPIC GASTRIC SLEEVE RESECTION    . VAGINAL HYSTERECTOMY      Family History  Problem Relation Age of Onset  . Hypertension Mother   . Hyperlipidemia Mother   . Thyroid disease Mother   . Migraines Mother   . Asthma Father   . Stroke Father   . Diabetes Father   . Hyperlipidemia Father   . Hypertension Father   . Thyroid disease Father   . Hyperlipidemia Sister   . Hypertension Sister      Social History   Socioeconomic History  . Marital status: Married    Spouse name: Not on file  . Number of children: 2  . Years of education: Not on file  . Highest education level: Not on file  Occupational History  . Occupation: school bus driver  Tobacco Use  . Smoking status: Former Smoker    Types: Cigarettes    Quit date: 2000    Years since quitting: 21.9  . Smokeless tobacco: Never Used  Vaping Use  . Vaping Use: Never used  Substance and Sexual Activity  . Alcohol use: Never  . Drug use: Never  . Sexual activity: Not on file  Other Topics Concern  . Not on file  Social History Narrative  . Not on file   Social Determinants of Health   Financial Resource Strain: Not on file  Food Insecurity: Not on file  Transportation Needs: Not on file  Physical Activity: Not on file  Stress: Not on file  Social Connections: Not on file  Intimate Partner Violence: Not on file    Outpatient Medications Prior to Visit  Medication Sig Dispense Refill  . ARIPiprazole (ABILIFY) 10 MG tablet Take 10 mg by mouth daily.    Marland Kitchen ketorolac (TORADOL) 10 MG tablet Take 1 tablet (10 mg total) by mouth every 6 (six) hours as needed. 20 tablet 3  . levothyroxine (SYNTHROID) 100 MCG tablet Take 1 tablet (100 mcg total) by mouth daily. 30 tablet  6  . losartan (COZAAR) 100 MG tablet TAKE ONE TABLET BY MOUTH EVERY DAY 90 tablet 2  . omeprazole (PRILOSEC) 40 MG capsule TAKE 1 CAPSULE BY MOUTH EVERY DAY 30 capsule 6  . Oxcarbazepine (TRILEPTAL) 300 MG tablet Take 300 mg by mouth 2 (two) times daily.    . pravastatin (PRAVACHOL) 20 MG tablet Take 20 mg by mouth daily.    . propranolol (INDERAL) 80 MG tablet TAKE ONE TABLET BY MOUTH EVERY DAY 30 tablet 6  . rizatriptan (MAXALT) 10 MG tablet TAKE ONE TABLET BY MOUTH AT ONSET OF HEADACHE. MAY REPEAT IN TWO HOURS IF NEEDED. 30 tablet 2  . sertraline (ZOLOFT) 100 MG tablet Take 100 mg by mouth daily.    Marland Kitchen amoxicillin-clavulanate (AUGMENTIN)  875-125 MG tablet Take 1 tablet by mouth 2 (two) times daily. 20 tablet 0  . predniSONE (STERAPRED UNI-PAK 21 TAB) 10 MG (21) TBPK tablet Take 6ills first day , then 5 pills day 2 and then cut down one pill day until gone 21 tablet 0   No facility-administered medications prior to visit.    Allergies:   Cefdinir and Risperidone   Social History   Tobacco Use  . Smoking status: Former Smoker    Types: Cigarettes    Quit date: 2000    Years since quitting: 21.9  . Smokeless tobacco: Never Used  Vaping Use  . Vaping Use: Never used  Substance Use Topics  . Alcohol use: Never  . Drug use: Never     Review of Systems  Constitutional: Positive for chills and malaise/fatigue. Negative for fever.  HENT: Positive for congestion and sinus pain.   Respiratory: Positive for cough. Negative for hemoptysis and sputum production.   Cardiovascular: Negative for chest pain, orthopnea and leg swelling.  Gastrointestinal: Negative for heartburn and nausea.  Genitourinary: Negative for dysuria and urgency.  Musculoskeletal: Negative for myalgias and neck pain.  Neurological: Positive for headaches.  Psychiatric/Behavioral: Negative.      Labs/Other Tests and Data Reviewed:    Recent Labs: 02/14/2020: TSH 1.490 09/02/2020: ALT 36; BUN 10; Creatinine, Ser 0.91; Hemoglobin 12.6; Platelets 249; Potassium 4.1; Sodium 141   Recent Lipid Panel Lab Results  Component Value Date/Time   CHOL 170 09/02/2020 09:12 AM   TRIG 291 (H) 09/02/2020 09:12 AM   HDL 31 (L) 09/02/2020 09:12 AM   CHOLHDL 5.5 (H) 09/02/2020 09:12 AM   LDLCALC 90 09/02/2020 09:12 AM    Wt Readings from Last 3 Encounters:  12/05/20 (!) 360 lb (163.3 kg)  11/19/20 (!) 360 lb (163.3 kg)  09/02/20 (!) 360 lb (163.3 kg)     Objective:    Vital Signs:  Ht 5\' 5"  (1.651 m)   Wt (!) 360 lb (163.3 kg)   BMI 59.91 kg/m    Physical Exam VS reviewed  ASSESSMENT & PLAN:   Diagnoses and all orders for this visit: Close  exposure to COVID-19 virus -     POC COVID-19 Patient exposed to Covid and needs to be checked  Acute non-recurrent sinusitis, unspecified location Other orders -     levofloxacin (LEVAQUIN) 500 MG tablet; Take 1 tablet (500 mg total) by mouth daily. -     fluconazole (DIFLUCAN) 150 MG tablet; Take 1 tablet (150 mg total) by mouth once for 1 dose. -     SARS-COV-2, NAA 2 DAY TAT Patient has symptoms consistent with sinusitis and will be treated and treated for candida.  COVID-19 Education: The signs and symptoms of COVID-19 were discussed with the patient and how to seek care for testing (follow up with PCP or arrange E-visit). The importance of social distancing was discussed today.   I spent 20 minutes dedicated to the care of this patient on the date of this encounter to include face-to-face time with the patient, as well as: discussed covid tranmission  Follow Up:  In Person prn  Signed,  Brent Bulla, MD  12/05/2020 10:07 AM    [facility]

## 2020-12-06 LAB — SARS-COV-2, NAA 2 DAY TAT

## 2020-12-08 ENCOUNTER — Encounter: Payer: Self-pay | Admitting: Legal Medicine

## 2020-12-08 NOTE — Progress Notes (Signed)
Covid test negative lp

## 2020-12-27 ENCOUNTER — Other Ambulatory Visit: Payer: Self-pay | Admitting: Legal Medicine

## 2021-01-02 ENCOUNTER — Other Ambulatory Visit: Payer: Self-pay | Admitting: Legal Medicine

## 2021-01-16 ENCOUNTER — Other Ambulatory Visit: Payer: Self-pay

## 2021-01-16 ENCOUNTER — Other Ambulatory Visit: Payer: Self-pay | Admitting: *Deleted

## 2021-01-16 ENCOUNTER — Ambulatory Visit (INDEPENDENT_AMBULATORY_CARE_PROVIDER_SITE_OTHER): Payer: BC Managed Care – PPO | Admitting: Podiatry

## 2021-01-16 ENCOUNTER — Encounter: Payer: Self-pay | Admitting: Podiatry

## 2021-01-16 DIAGNOSIS — M79676 Pain in unspecified toe(s): Secondary | ICD-10-CM | POA: Diagnosis not present

## 2021-01-16 DIAGNOSIS — L600 Ingrowing nail: Secondary | ICD-10-CM

## 2021-01-16 DIAGNOSIS — Z6841 Body Mass Index (BMI) 40.0 and over, adult: Secondary | ICD-10-CM | POA: Insufficient documentation

## 2021-01-16 DIAGNOSIS — M1990 Unspecified osteoarthritis, unspecified site: Secondary | ICD-10-CM | POA: Insufficient documentation

## 2021-01-16 DIAGNOSIS — J45909 Unspecified asthma, uncomplicated: Secondary | ICD-10-CM | POA: Insufficient documentation

## 2021-01-16 MED ORDER — NEOMYCIN-POLYMYXIN-HC 3.5-10000-1 OT SOLN: OTIC | 0 refills | Status: DC

## 2021-01-16 NOTE — Progress Notes (Signed)
  Subjective:  Patient ID: Erin Henderson, female    DOB: [birth year],  MRN: [id number]  Chief Complaint  Patient presents with  . Nail Problem    I have an ingrown on the left big toe and was infected and I went to the pedicure place and they dug it out    45 y.o. female presents with the above complaint. History confirmed with patient.   Objective:  Physical Exam: warm, good capillary refill, no trophic changes or ulcerative lesions, normal DP and PT pulses and normal sensory exam.  Painful ingrowing nail at both borders of the left, right, hallux; without warmth, erythema or drainage  Assessment:   1. Ingrown nail   2. Pain around toenail    Plan:  Patient was evaluated and treated and all questions answered.  Ingrown Nail, bilateral -Patient elects to proceed with ingrown toenail removal today -Ingrown nail excised. See procedure note. -Educated on post-procedure care including soaking. Written instructions provided. -Rx for Cortisporin drops -Patient to follow up in 2 weeks for nail check.  Procedure: Excision of Ingrown Toenail Location: Bilateral 1st toe both nail borders. Anesthesia: Lidocaine 1% plain; 1.19mL and Marcaine 0.5% plain; 1.6mL, digital block. Skin Prep: Betadine. Dressing: Silvadene; telfa; dry, sterile, compression dressing. Technique: Following skin prep, the toe was exsanguinated and a tourniquet was secured at the base of the toe. The affected nail border was freed, split with a nail splitter, and excised. Chemical matrixectomy was then performed with phenol and irrigated out with alcohol. The tourniquet was then removed and sterile dressing applied. Disposition: Patient tolerated procedure well. Patient to return in 2 weeks for follow-up.  Return in about 2 weeks (around 01/30/2021) for nail check.

## 2021-01-16 NOTE — Patient Instructions (Signed)

## 2021-01-23 MED ORDER — DOXYCYCLINE HYCLATE 100 MG PO TABS: 100.0000 mg | ORAL_TABLET | Freq: Two times a day (BID) | ORAL | 0 refills | Status: DC

## 2021-01-23 NOTE — Addendum Note (Signed)
Addended by: Hadley Pen R on: 01/23/2021 04:56 PM   Modules accepted: Orders

## 2021-01-30 ENCOUNTER — Ambulatory Visit (INDEPENDENT_AMBULATORY_CARE_PROVIDER_SITE_OTHER): Payer: BC Managed Care – PPO | Admitting: Podiatry

## 2021-01-30 ENCOUNTER — Other Ambulatory Visit: Payer: Self-pay

## 2021-01-30 ENCOUNTER — Encounter: Payer: Self-pay | Admitting: Podiatry

## 2021-01-30 DIAGNOSIS — M79676 Pain in unspecified toe(s): Secondary | ICD-10-CM

## 2021-01-30 DIAGNOSIS — L600 Ingrowing nail: Secondary | ICD-10-CM | POA: Diagnosis not present

## 2021-01-30 NOTE — Progress Notes (Signed)
  Subjective:  Patient ID: Erin Henderson, female    DOB: 08-10-1976,  MRN: [id number]  Chief Complaint  Patient presents with  . Nail Problem    I am doing better and the antibiotics have helped but the toes are looking red and swollen   45 y.o. female presents for follow up of nail procedure. History confirmed with patient. States that the antibiotic helped but now they are getting a little sore and red today.  Objective:  Physical Exam: Ingrown nail avulsion site: overlying soft crust, no warmth, no drainage and no erythema. Cool red discoloration to both great toenails. Assessment:   1. Ingrown nail   2. Pain around toenail    Plan:  Patient was evaluated and treated and all questions answered.  S/p Ingrown Toenail Excision, bilateral -Healing well without issue. The redness is more discoloration from phenol rather than erythema concerning for infection. -Discussed return precautions. -F/u PRN

## 2021-02-04 ENCOUNTER — Telehealth (INDEPENDENT_AMBULATORY_CARE_PROVIDER_SITE_OTHER): Payer: BC Managed Care – PPO | Admitting: Legal Medicine

## 2021-02-04 ENCOUNTER — Telehealth: Payer: Self-pay

## 2021-02-04 ENCOUNTER — Encounter: Payer: Self-pay | Admitting: Legal Medicine

## 2021-02-04 DIAGNOSIS — U071 COVID-19: Secondary | ICD-10-CM | POA: Insufficient documentation

## 2021-02-04 LAB — POC COVID19 BINAXNOW: SARS Coronavirus 2 Ag: POSITIVE — AB

## 2021-02-04 NOTE — Telephone Encounter (Signed)
It should be available at [facility] drug lp

## 2021-02-04 NOTE — Progress Notes (Signed)
Positive covid lp

## 2021-02-04 NOTE — Progress Notes (Signed)
Virtual Visit via Telephone Note   This visit type was conducted due to national recommendations for restrictions regarding the COVID-19 Pandemic (e.g. social distancing) in an effort to limit this patient's exposure and mitigate transmission in our community.  Due to her co-morbid illnesses, this patient is at least at moderate risk for complications without adequate follow up.  This format is felt to be most appropriate for this patient at this time.  The patient did not have access to video technology/had technical difficulties with video requiring transitioning to audio format only (telephone).  All issues noted in this document were discussed and addressed.  No physical exam could be performed with this format.  Patient verbally consented to a telehealth visit.   Date:  02/04/2021   ID:  Erin Henderson, DOB February 01, 1976, MRN [id number]  Patient Location: Home Provider Location: Office/Clinic  PCP:  Abigail Miyamoto, MD   Evaluation Performed:  New Patient Evaluation  Chief Complaint:  Nose congested and headache  History of Present Illness:    Erin Henderson is a 45 y.o. female with Nose congested and headache.  Positive home covid test needed for work.no fever but very fatigued    The patient does have symptoms concerning for COVID-19 infection (fever, chills, cough, or new shortness of breath).    Past Medical History:  Diagnosis Date  . GERD with esophagitis   . Hyperlipidemia   . Hypertension   . Hypothyroidism     Past Surgical History:  Procedure Laterality Date  . CHOLECYSTECTOMY    . LAPAROSCOPIC GASTRIC SLEEVE RESECTION    . VAGINAL HYSTERECTOMY      Family History  Problem Relation Age of Onset  . Hypertension Mother   . Hyperlipidemia Mother   . Thyroid disease Mother   . Migraines Mother   . Asthma Father   . Stroke Father   . Diabetes Father   . Hyperlipidemia Father   . Hypertension Father   . Thyroid disease Father   . Hyperlipidemia  Sister   . Hypertension Sister     Social History   Socioeconomic History  . Marital status: Married    Spouse name: Not on file  . Number of children: 2  . Years of education: Not on file  . Highest education level: Not on file  Occupational History  . Occupation: school bus driver  Tobacco Use  . Smoking status: Former Smoker    Types: Cigarettes    Quit date: 2000    Years since quitting: 22.1  . Smokeless tobacco: Never Used  Vaping Use  . Vaping Use: Never used  Substance and Sexual Activity  . Alcohol use: Never  . Drug use: Never  . Sexual activity: Not on file  Other Topics Concern  . Not on file  Social History Narrative  . Not on file   Social Determinants of Health   Financial Resource Strain: Not on file  Food Insecurity: Not on file  Transportation Needs: Not on file  Physical Activity: Not on file  Stress: Not on file  Social Connections: Not on file  Intimate Partner Violence: Not on file    Outpatient Medications Prior to Visit  Medication Sig Dispense Refill  . ARIPiprazole (ABILIFY) 10 MG tablet Take 10 mg by mouth daily.    . fluconazole (DIFLUCAN) 150 MG tablet TAKE 1 TABLET BY MOUTH AS ONE DOSE.    Marland Kitchen ketorolac (TORADOL) 10 MG tablet Take 1 tablet (10 mg total) by mouth every 6 (six)  hours as needed. 20 tablet 3  . levothyroxine (SYNTHROID) 100 MCG tablet Take 1 tablet (100 mcg total) by mouth daily. 30 tablet 6  . losartan (COZAAR) 100 MG tablet TAKE ONE TABLET BY MOUTH EVERY DAY 90 tablet 2  . neomycin-polymyxin-hydrocortisone (CORTISPORIN) OTIC solution Apply 2 drops to the ingrown toenail site twice daily. Cover with band-aid. 10 mL 0  . omeprazole (PRILOSEC) 40 MG capsule TAKE 1 CAPSULE BY MOUTH ONCE DAILY. 30 capsule 6  . Oxcarbazepine (TRILEPTAL) 300 MG tablet Take 300 mg by mouth 2 (two) times daily.    . pravastatin (PRAVACHOL) 20 MG tablet Take 20 mg by mouth daily.    . propranolol (INDERAL) 80 MG tablet TAKE ONE TABLET BY MOUTH  EVERY DAY 30 tablet 6  . rizatriptan (MAXALT) 10 MG tablet TAKE ONE TABLET BY MOUTH AT ONSET OF HEADACHE. MAY REPEAT IN TWO HOURS IF NEEDED. 30 tablet 2  . sertraline (ZOLOFT) 100 MG tablet Take 100 mg by mouth daily.    Marland Kitchen doxycycline (VIBRA-TABS) 100 MG tablet Take 1 tablet (100 mg total) by mouth 2 (two) times daily. 10 tablet 0  . levofloxacin (LEVAQUIN) 500 MG tablet Take 1 tablet (500 mg total) by mouth daily. 7 tablet 0   No facility-administered medications prior to visit.    Allergies:   Cefdinir and Risperidone   Social History   Tobacco Use  . Smoking status: Former Smoker    Types: Cigarettes    Quit date: 2000    Years since quitting: 22.1  . Smokeless tobacco: Never Used  Vaping Use  . Vaping Use: Never used  Substance Use Topics  . Alcohol use: Never  . Drug use: Never     Review of Systems  Constitutional: Positive for malaise/fatigue. Negative for chills and fever.  HENT: Negative for sinus pain.   Eyes: Negative for redness.  Respiratory: Positive for cough.   Cardiovascular: Negative for chest pain.  Gastrointestinal: Negative for melena.  Genitourinary: Negative for dysuria.  Musculoskeletal: Positive for myalgias.  Neurological: Positive for headaches.     Labs/Other Tests and Data Reviewed:    Recent Labs: 02/14/2020: TSH 1.490 09/02/2020: ALT 36; BUN 10; Creatinine, Ser 0.91; Hemoglobin 12.6; Platelets 249; Potassium 4.1; Sodium 141   Recent Lipid Panel Lab Results  Component Value Date/Time   CHOL 170 09/02/2020 09:12 AM   TRIG 291 (H) 09/02/2020 09:12 AM   HDL 31 (L) 09/02/2020 09:12 AM   CHOLHDL 5.5 (H) 09/02/2020 09:12 AM   LDLCALC 90 09/02/2020 09:12 AM    Wt Readings from Last 3 Encounters:  02/04/21 (!) 360 lb (163.3 kg)  12/05/20 (!) 360 lb (163.3 kg)  11/19/20 (!) 360 lb (163.3 kg)     Objective:    Vital Signs:  Ht 5\' 5"  (1.651 m)   Wt (!) 360 lb (163.3 kg)   BMI 59.91 kg/m    Physical Exam vs reviewed  ASSESSMENT &  PLAN:   Diagnoses and all orders for this visit: COVID -     POC COVID-19 -     Novel Coronavirus, NAA ([facility]) positive covid in office.  I gave Rx for mulnupavir for COVID       COVID-19 Education: The signs and symptoms of COVID-19 were discussed with the patient and how to seek care for testing (follow up with PCP or arrange E-visit). The importance of social distancing was discussed today.   I spent 20 minutes dedicated to the care of this patient on the  date of this encounter to include face-to-face time with the patient, as well as:   Follow Up:  In Person prn  Signed,  Brent Bulla, MD  02/04/2021 2:53 PM    [facility] [place]

## 2021-02-04 NOTE — Telephone Encounter (Signed)
Pt left VM stating prescription given could not be filled at her pharmacy as they did not have it. They told her to call us and ask for recommendations of pharmacies since that medication is scarce. Please advise as I do not know what medication was sent and pharmacies.

## 2021-02-05 LAB — SARS-COV-2, NAA 2 DAY TAT

## 2021-02-05 LAB — NOVEL CORONAVIRUS, NAA: SARS-CoV-2, NAA: DETECTED — AB

## 2021-02-05 NOTE — Progress Notes (Signed)
Covid positive lp

## 2021-02-06 ENCOUNTER — Telehealth: Payer: Self-pay

## 2021-02-06 NOTE — Telephone Encounter (Signed)
Called to discuss with patient about COVID-19 symptoms and the use of one of the available treatments for those with mild to moderate Covid symptoms and at a high risk of hospitalization.  Pt appears to qualify for outpatient treatment due to co-morbid conditions and/or a member of an at-risk group in accordance with the FDA Emergency Use Authorization.    Symptom onset: Unknown Vaccinated: Yes Booster? Yes Immunocompromised? No Qualifiers: HTN  PCP treated pt. With mulnupavir. Left pt. Message and call back number if she has questions.  Esther Hardy

## 2021-03-11 ENCOUNTER — Ambulatory Visit (INDEPENDENT_AMBULATORY_CARE_PROVIDER_SITE_OTHER): Payer: BC Managed Care – PPO | Admitting: Legal Medicine

## 2021-03-11 ENCOUNTER — Other Ambulatory Visit: Payer: Self-pay

## 2021-03-11 ENCOUNTER — Encounter: Payer: Self-pay | Admitting: Legal Medicine

## 2021-03-11 VITALS — BP 130/70 | HR 73 | Temp 97.6°F | Resp 18 | Ht 65.0 in | Wt 355.0 lb

## 2021-03-11 DIAGNOSIS — E782 Mixed hyperlipidemia: Secondary | ICD-10-CM

## 2021-03-11 DIAGNOSIS — E038 Other specified hypothyroidism: Secondary | ICD-10-CM | POA: Diagnosis not present

## 2021-03-11 DIAGNOSIS — F321 Major depressive disorder, single episode, moderate: Secondary | ICD-10-CM

## 2021-03-11 DIAGNOSIS — I10 Essential (primary) hypertension: Secondary | ICD-10-CM | POA: Diagnosis not present

## 2021-03-11 DIAGNOSIS — K2100 Gastro-esophageal reflux disease with esophagitis, without bleeding: Secondary | ICD-10-CM | POA: Diagnosis not present

## 2021-03-11 DIAGNOSIS — R739 Hyperglycemia, unspecified: Secondary | ICD-10-CM

## 2021-03-11 DIAGNOSIS — J4520 Mild intermittent asthma, uncomplicated: Secondary | ICD-10-CM

## 2021-03-11 NOTE — Progress Notes (Signed)
Subjective:  Patient ID: Erin Henderson, female    DOB: 1976-02-23  Age: 45 y.o. MRN: [id number]  Chief Complaint  Patient presents with  . Hypertension  . Hyperlipidemia    HPI: chronic visit  Patient presents for follow up of hypertension.  Patient tolerating losartan well with side effects.  Patient was diagnosed with hypertension 2010 so has been treated for hypertension for 10 years.Patient is working on maintaining diet and exercise regimen and follows up as directed. Complication include none.  Patient presents with hyperlipidemia.  Compliance with treatment has been good; patient takes medicines as directed, maintains low cholesterol diet, follows up as directed, and maintains exercise regimen.  Patient is using pravasttin without problems.   Current Outpatient Medications on File Prior to Visit  Medication Sig Dispense Refill  . ARIPiprazole (ABILIFY) 10 MG tablet Take 10 mg by mouth daily.    . fluconazole (DIFLUCAN) 150 MG tablet TAKE 1 TABLET BY MOUTH AS ONE DOSE.    Marland Kitchen ketorolac (TORADOL) 10 MG tablet Take 1 tablet (10 mg total) by mouth every 6 (six) hours as needed. 20 tablet 3  . levothyroxine (SYNTHROID) 100 MCG tablet Take 1 tablet (100 mcg total) by mouth daily. 30 tablet 6  . losartan (COZAAR) 100 MG tablet TAKE ONE TABLET BY MOUTH EVERY DAY 90 tablet 2  . omeprazole (PRILOSEC) 40 MG capsule TAKE 1 CAPSULE BY MOUTH ONCE DAILY. 30 capsule 6  . Oxcarbazepine (TRILEPTAL) 300 MG tablet Take 300 mg by mouth 2 (two) times daily.    . pravastatin (PRAVACHOL) 20 MG tablet Take 20 mg by mouth daily.    . propranolol (INDERAL) 80 MG tablet TAKE ONE TABLET BY MOUTH EVERY DAY 30 tablet 6  . rizatriptan (MAXALT) 10 MG tablet TAKE ONE TABLET BY MOUTH AT ONSET OF HEADACHE. MAY REPEAT IN TWO HOURS IF NEEDED. 30 tablet 2  . sertraline (ZOLOFT) 100 MG tablet Take 100 mg by mouth daily.     No current facility-administered medications on file prior to visit.   Past Medical History:   Diagnosis Date  . GERD with esophagitis   . Hyperlipidemia   . Hypertension   . Hypothyroidism    Past Surgical History:  Procedure Laterality Date  . CHOLECYSTECTOMY    . LAPAROSCOPIC GASTRIC SLEEVE RESECTION    . VAGINAL HYSTERECTOMY      Family History  Problem Relation Age of Onset  . Hypertension Mother   . Hyperlipidemia Mother   . Thyroid disease Mother   . Migraines Mother   . Asthma Father   . Stroke Father   . Diabetes Father   . Hyperlipidemia Father   . Hypertension Father   . Thyroid disease Father   . Hyperlipidemia Sister   . Hypertension Sister    Social History   Socioeconomic History  . Marital status: Married    Spouse name: Not on file  . Number of children: 2  . Years of education: Not on file  . Highest education level: Not on file  Occupational History  . Occupation: school bus driver  Tobacco Use  . Smoking status: Former Smoker    Types: Cigarettes    Quit date: 2000    Years since quitting: 22.2  . Smokeless tobacco: Never Used  Vaping Use  . Vaping Use: Never used  Substance and Sexual Activity  . Alcohol use: Never  . Drug use: Never  . Sexual activity: Not on file  Other Topics Concern  . Not on  file  Social History Narrative  . Not on file   Social Determinants of Health   Financial Resource Strain: Not on file  Food Insecurity: Not on file  Transportation Needs: Not on file  Physical Activity: Not on file  Stress: Not on file  Social Connections: Not on file    Review of Systems  Constitutional: Negative for activity change and appetite change.  HENT: Positive for congestion.   Eyes: Negative for visual disturbance.  Respiratory: Negative for chest tightness and shortness of breath.   Cardiovascular: Negative for chest pain, palpitations and leg swelling.  Gastrointestinal: Negative for abdominal distention and abdominal pain.  Endocrine: Negative for polyuria.  Genitourinary: Negative for difficulty urinating,  dysuria and urgency.  Musculoskeletal: Negative for arthralgias and back pain.  Neurological: Negative.   Psychiatric/Behavioral: Negative.      Objective:  BP 130/70 (BP Location: Right Arm, Patient Position: Sitting, Cuff Size: Large)   Pulse 73   Temp 97.6 F (36.4 C) (Temporal)   Resp 18   Ht 5\' 5"  (1.651 m)   Wt (!) 355 lb (161 kg)   SpO2 97%   BMI 59.08 kg/m   BP/Weight 03/11/2021 02/04/2021 12/05/2020  Systolic BP 130 - -  Diastolic BP 70 - -  Wt. (Lbs) 355 360 360  BMI 59.08 59.91 59.91    Physical Exam Vitals reviewed.  Constitutional:      Appearance: Normal appearance.  HENT:     Head: Normocephalic and atraumatic.     Right Ear: Tympanic membrane, ear canal and external ear normal.     Left Ear: Tympanic membrane, ear canal and external ear normal.     Nose: Nose normal.     Mouth/Throat:     Mouth: Mucous membranes are moist.     Pharynx: Oropharynx is clear.  Eyes:     Extraocular Movements: Extraocular movements intact.     Conjunctiva/sclera: Conjunctivae normal.     Pupils: Pupils are equal, round, and reactive to light.  Cardiovascular:     Rate and Rhythm: Normal rate and regular rhythm.     Pulses: Normal pulses.     Heart sounds: Normal heart sounds. No murmur heard. No gallop.   Pulmonary:     Effort: Pulmonary effort is normal. No respiratory distress.     Breath sounds: Normal breath sounds. No rales.  Abdominal:     General: Abdomen is flat. Bowel sounds are normal. There is no distension.     Palpations: Abdomen is soft.     Tenderness: There is no abdominal tenderness.  Musculoskeletal:        General: No tenderness. Normal range of motion.     Cervical back: Normal range of motion.  Skin:    General: Skin is warm.     Capillary Refill: Capillary refill takes less than 2 seconds.  Neurological:     General: No focal deficit present.     Mental Status: She is alert. Mental status is at baseline.  Psychiatric:        Mood and  Affect: Mood normal.        Behavior: Behavior normal.        Thought Content: Thought content normal.        Judgment: Judgment normal.       Lab Results  Component Value Date   WBC 10.5 09/02/2020   HGB 12.6 09/02/2020   HCT 40.5 09/02/2020   PLT 249 09/02/2020   GLUCOSE 88 09/02/2020   CHOL  170 09/02/2020   TRIG 291 (H) 09/02/2020   HDL 31 (L) 09/02/2020   LDLCALC 90 09/02/2020   ALT 36 (H) 09/02/2020   AST 18 09/02/2020   NA 141 09/02/2020   K 4.1 09/02/2020   CL 101 09/02/2020   CREATININE 0.91 09/02/2020   BUN 10 09/02/2020   CO2 25 09/02/2020   TSH 1.490 02/14/2020   Depression screen PHQ 2/9 03/11/2021 09/02/2020  Decreased Interest 0 2  Down, Depressed, Hopeless 0 0  PHQ - 2 Score 0 2  Altered sleeping 0 0  Tired, decreased energy 1 2  Change in appetite 0 1  Feeling bad or failure about yourself  0 0  Trouble concentrating 0 0  Moving slowly or fidgety/restless 0 0  Suicidal thoughts 0 0  PHQ-9 Score 1 5  Difficult doing work/chores Not difficult at all Not difficult at all     Assessment & Plan:  Diagnoses and all orders for this visit: Essential hypertension -     CBC with Differential/Platelet -     Comprehensive metabolic panel An individual hypertension care plan was established and reinforced today.  The patient's status was assessed using clinical findings on exam and labs or diagnostic tests. The patient's success at meeting treatment goals on disease specific evidence-based guidelines and found to be well controlled. SELF MANAGEMENT: The patient and I together assessed ways to personally work towards obtaining the recommended goals. RECOMMENDATIONS: avoid decongestants found in common cold remedies, decrease consumption of alcohol, perform routine monitoring of BP with home BP cuff, exercise, reduction of dietary salt, take medicines as prescribed, try not to miss doses and quit smoking.  Regular exercise and maintaining a healthy weight is  needed.  Stress reduction may help. A CLINICAL SUMMARY including written plan identify barriers to care unique to individual due to social or financial issues.  We attempt to mutually creat solutions for individual and family understanding.  Mixed hyperlipidemia -     Lipid panel AN INDIVIDUAL CARE PLAN for hyperlipidemia/ cholesterol was established and reinforced today.  The patient's status was assessed using clinical findings on exam, lab and other diagnostic tests. The patient's disease status was assessed based on evidence-based guidelines and found to be fair controlled. MEDICATIONS were reviewed. SELF MANAGEMENT GOALS have been discussed and patient's success at attaining the goal of low cholesterol was assessed. RECOMMENDATION given include regular exercise 3 days a week and low cholesterol/low fat diet. CLINICAL SUMMARY including written plan to identify barriers unique to the patient due to social or economic  reasons was discussed.  Other specified hypothyroidism -     TSH Patient is known to have hypothyroidism  and is n treatment with levothyroxine .  Patient was diagnosed 10 years ago.  Other treatment includes none.  Patient is compliant with medicines and last TSH 6 months ago.  Last TSH was normal . Gastroesophageal reflux disease with esophagitis without hemorrhage Plan of care was formulated today.  She is doing well.  A plan of care was formulated using patient exam, tests and other sources to optimize care using evidence based information.  Recommend no smoking, no eating after supper, avoid fatty foods, elevate Head of bed, avoid tight fitting clothing.  Continue on omeprzole . Morbid obesity (HCC) An individualize plan was formulated for obesity using patient history and physical exam to encourage weight loss.  An evidence based program was formulated.  Patient is to cut portion size with meals and to plan physical exercise 3  days a week at least 20 minutes.  Weight  watchers and other programs are helpful.  Planned amount of weight loss 10 lbs.  Current moderate episode of major depressive disorder without prior episode (HCC) Patient's depression is controlled with zoloft.   Anhedonia better.  PHQ 9 was performed score 1. An individual care plan was established or reinforced today.  The patient's disease status was assessed using clinical findings on exam, labs, and or other diagnostic testing to determine patient's success in meeting treatment goals based on disease specific evidence-based guidelines and found to be improving Recommendations include stay on medicines  Mild intermittent asthma, unspecified whether complicated This patient has asthma mild and is on non now.  Patient is not having a flair.  Chronic medicines include none. Addition new medicines none.  Asthma action plan is in place.   Hyperglycemia -     Hemoglobin A1c Patient has history of hyperglycemia    Orders Placed This Encounter  Procedures  . CBC with Differential/Platelet  . Comprehensive metabolic panel  . Lipid panel  . TSH  . Hemoglobin A1c      I spent 30 minutes dedicated to the care of this patient on the date of this encounter to include face-to-face time with the patient, as well as: review records  Follow-up: Return in about 6 months (around 09/11/2021) for fasting.  An After Visit Summary was printed and given to the patient.  Brent BullaLawrence Cedrik Heindl, MD [facility] [phone]

## 2021-03-12 LAB — CBC WITH DIFFERENTIAL/PLATELET
Basophils Absolute: 0 10*3/uL (ref 0.0–0.2)
Basos: 0 %
EOS (ABSOLUTE): 0.1 10*3/uL (ref 0.0–0.4)
Eos: 1 %
Hematocrit: 38.7 % (ref 34.0–46.6)
Hemoglobin: 12.3 g/dL (ref 11.1–15.9)
Immature Grans (Abs): 0 10*3/uL (ref 0.0–0.1)
Immature Granulocytes: 0 %
Lymphocytes Absolute: 3 10*3/uL (ref 0.7–3.1)
Lymphs: 32 %
MCH: 24.8 pg — ABNORMAL LOW (ref 26.6–33.0)
MCHC: 31.8 g/dL (ref 31.5–35.7)
MCV: 78 fL — ABNORMAL LOW (ref 79–97)
Monocytes Absolute: 0.5 10*3/uL (ref 0.1–0.9)
Monocytes: 5 %
Neutrophils Absolute: 5.7 10*3/uL (ref 1.4–7.0)
Neutrophils: 62 %
Platelets: 241 10*3/uL (ref 150–450)
RBC: 4.95 x10E6/uL (ref 3.77–5.28)
RDW: 14.5 % (ref 11.7–15.4)
WBC: 9.4 10*3/uL (ref 3.4–10.8)

## 2021-03-12 LAB — COMPREHENSIVE METABOLIC PANEL
ALT: 32 IU/L (ref 0–32)
AST: 17 IU/L (ref 0–40)
Albumin/Globulin Ratio: 1.9 (ref 1.2–2.2)
Albumin: 4.2 g/dL (ref 3.8–4.8)
Alkaline Phosphatase: 133 IU/L — ABNORMAL HIGH (ref 44–121)
BUN/Creatinine Ratio: 11 (ref 9–23)
BUN: 11 mg/dL (ref 6–24)
Bilirubin Total: 0.3 mg/dL (ref 0.0–1.2)
CO2: 23 mmol/L (ref 20–29)
Calcium: 9.1 mg/dL (ref 8.7–10.2)
Chloride: 99 mmol/L (ref 96–106)
Creatinine, Ser: 0.97 mg/dL (ref 0.57–1.00)
Globulin, Total: 2.2 g/dL (ref 1.5–4.5)
Glucose: 97 mg/dL (ref 65–99)
Potassium: 3.9 mmol/L (ref 3.5–5.2)
Sodium: 142 mmol/L (ref 134–144)
Total Protein: 6.4 g/dL (ref 6.0–8.5)
eGFR: 73 mL/min/{1.73_m2} (ref >59)

## 2021-03-12 LAB — LIPID PANEL
Chol/HDL Ratio: 5.2 ratio — ABNORMAL HIGH (ref 0.0–4.4)
Cholesterol, Total: 170 mg/dL (ref 100–199)
HDL: 33 mg/dL — ABNORMAL LOW (ref >39)
LDL Chol Calc (NIH): 90 mg/dL (ref 0–99)
Triglycerides: 278 mg/dL — ABNORMAL HIGH (ref 0–149)
VLDL Cholesterol Cal: 47 mg/dL — ABNORMAL HIGH (ref 5–40)

## 2021-03-12 LAB — CARDIOVASCULAR RISK ASSESSMENT

## 2021-03-12 LAB — HEMOGLOBIN A1C
Est. average glucose Bld gHb Est-mCnc: 100 mg/dL
Hgb A1c MFr Bld: 5.1 % (ref 4.8–5.6)

## 2021-03-12 LAB — TSH: TSH: 2.79 u[IU]/mL (ref 0.450–4.500)

## 2021-03-12 NOTE — Progress Notes (Signed)
A1c 5.1 normal range, cbc normal, kidney and liver tests normal, triglycerides high 178, TSH 1.79 normal,  lp

## 2021-03-18 ENCOUNTER — Other Ambulatory Visit: Payer: Self-pay

## 2021-03-18 ENCOUNTER — Ambulatory Visit (INDEPENDENT_AMBULATORY_CARE_PROVIDER_SITE_OTHER): Payer: BC Managed Care – PPO | Admitting: Sports Medicine

## 2021-03-18 ENCOUNTER — Encounter: Payer: Self-pay | Admitting: Sports Medicine

## 2021-03-18 DIAGNOSIS — M79675 Pain in left toe(s): Secondary | ICD-10-CM

## 2021-03-18 DIAGNOSIS — L929 Granulomatous disorder of the skin and subcutaneous tissue, unspecified: Secondary | ICD-10-CM

## 2021-03-18 DIAGNOSIS — M79676 Pain in unspecified toe(s): Secondary | ICD-10-CM

## 2021-03-18 MED ORDER — DOXYCYCLINE HYCLATE 100 MG PO TABS: 100.0000 mg | ORAL_TABLET | Freq: Two times a day (BID) | ORAL | 0 refills | Status: DC

## 2021-03-18 NOTE — Progress Notes (Signed)
Subjective: Erin Henderson is a 45 y.o. female patient returns to office today for follow up evaluation after having bilateral nail procedures 6 to 8 weeks ago performed by Dr. Samuella Cota.  Patient reports that her toes were healing good but over the last few weeks has noticed a little bit of redness and soreness on the left great toe along the lateral margin with a small raised area of tissue reports that after she started taking some antibiotic pills that she borrowed from her sister the redness and swelling at the left hallux has gotten better.  Reports that she has also been using antibiotic cream to the area as well.  Patient denies nausea vomiting fever chills and denies any repeat injury or trauma to the toe or toenail.  Patient is assisted by husband this visit.  Patient Active Problem List   Diagnosis Date Noted  . COVID 02/04/2021  . Arthritis 01/16/2021  . BMI 50.0-59.9, adult (HCC) 01/16/2021  . Asthma 01/16/2021  . Sinusitis 11/19/2020  . At increased risk of exposure to COVID-19 virus 11/19/2020  . Hyperlipidemia   . Hypothyroidism 02/07/2020  . Mixed hyperlipidemia 02/07/2020  . Essential hypertension 02/07/2020  . GERD with esophagitis 02/07/2020  . 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 Bariatric surgery status 02/07/2020  . Morbid obesity (HCC) 02/07/2020  . Classical migraine 02/07/2020  . Current moderate episode of major depressive disorder without prior episode (HCC) 07/21/2018  . Depression with anxiety 12/08/2017    Current Outpatient Medications on File Prior to Visit  Medication Sig Dispense Refill  . ARIPiprazole (ABILIFY) 10 MG tablet Take 10 mg by mouth daily.    . fluconazole (DIFLUCAN) 150 MG tablet TAKE 1 TABLET BY MOUTH AS ONE DOSE.    Marland Kitchen ketorolac (TORADOL) 10 MG tablet Take 1 tablet (10 mg total) by mouth every 6 (six) hours as needed. 20 tablet 3  . levothyroxine (SYNTHROID) 100 MCG tablet Take 1 tablet (100 mcg total) by mouth daily. 30  tablet 6  . losartan (COZAAR) 100 MG tablet TAKE ONE TABLET BY MOUTH EVERY DAY 90 tablet 2  . omeprazole (PRILOSEC) 40 MG capsule TAKE 1 CAPSULE BY MOUTH ONCE DAILY. 30 capsule 6  . Oxcarbazepine (TRILEPTAL) 300 MG tablet Take 300 mg by mouth 2 (two) times daily.    . pravastatin (PRAVACHOL) 20 MG tablet Take 20 mg by mouth daily.    . propranolol (INDERAL) 80 MG tablet TAKE ONE TABLET BY MOUTH EVERY DAY 30 tablet 6  . rizatriptan (MAXALT) 10 MG tablet TAKE ONE TABLET BY MOUTH AT ONSET OF HEADACHE. MAY REPEAT IN TWO HOURS IF NEEDED. 30 tablet 2  . sertraline (ZOLOFT) 100 MG tablet Take 100 mg by mouth daily.     No current facility-administered medications on file prior to visit.    Allergies  Allergen Reactions  . Cefdinir Hives and Nausea And Vomiting  . Risperidone Other (See Comments)    unknown    Objective:  General: Well developed, nourished, in no acute distress, alert and oriented x3   Dermatology: Skin is warm, dry and supple bilateral.  Left hallux lateral nail bed appears to be clean, dry, with a small amount of hyper granular tissue at this nail fold with blanchable erythema, minimal edema, minimal clear drainage present. The remaining nails appear unremarkable at this time. There are no other lesions or other signs of infection present.  Neurovascular status: Intact. No lower extremity swelling; No pain with calf compression bilateral.  Musculoskeletal: There  is mild tenderness to palpation to the left hallux at the lateral margin.  Assesement and Plan: Problem List Items Addressed This Visit   None   Visit Diagnoses    Pain around toenail    -  Primary   Ingrown nail       Toe pain, left          -Examined patient  -Cleansed left hallux lateral nail fold and applied a small amount of Lumicain to see if this will help with cauterizing the granuloma -Discussed plan of care with patient. -Patient to keep soaking with Epson salt once daily and apply Corticosporin  solution and to discontinue antibiotic cream -Prescribed doxycycline for patient to take as instructed. -Educated patient on long term care after nail surgery -Patient was instructed to monitor the toe for reoccurrence and signs of infection; Patient advised to return to office or go to ER if toe becomes red, hot or swollen. -Advised patient if her toe nail is not better at next visit she may need a repeat nail procedure at the left hallux lateral margin -Patient is to return as scheduled or sooner if problems arise.  Asencion Islam, DPM

## 2021-03-31 ENCOUNTER — Other Ambulatory Visit: Payer: Self-pay | Admitting: Legal Medicine

## 2021-03-31 DIAGNOSIS — G43709 Chronic migraine without aura, not intractable, without status migrainosus: Secondary | ICD-10-CM

## 2021-04-01 ENCOUNTER — Encounter: Payer: Self-pay | Admitting: Sports Medicine

## 2021-04-01 ENCOUNTER — Other Ambulatory Visit: Payer: Self-pay

## 2021-04-01 ENCOUNTER — Ambulatory Visit (INDEPENDENT_AMBULATORY_CARE_PROVIDER_SITE_OTHER): Payer: BC Managed Care – PPO | Admitting: Sports Medicine

## 2021-04-01 DIAGNOSIS — L600 Ingrowing nail: Secondary | ICD-10-CM

## 2021-04-01 DIAGNOSIS — L929 Granulomatous disorder of the skin and subcutaneous tissue, unspecified: Secondary | ICD-10-CM

## 2021-04-01 DIAGNOSIS — M79675 Pain in left toe(s): Secondary | ICD-10-CM

## 2021-04-01 DIAGNOSIS — M79676 Pain in unspecified toe(s): Secondary | ICD-10-CM | POA: Diagnosis not present

## 2021-04-01 NOTE — Progress Notes (Signed)
Subjective: Erin Henderson is a 45 y.o. female patient returns to office today for follow up evaluation after having bilateral nail procedures 10-12 weeks ago performed by Dr. Samuella Cota.  Patient reports that left big toe is doing a lot better however still sore no longer draining.  Patient denies nausea vomiting fever chills and denies any repeat injury or trauma to the toe or toenail since last encounter.  Patient is assisted by husband and son this visit.  Patient Active Problem List   Diagnosis Date Noted  . COVID 02/04/2021  . Arthritis 01/16/2021  . BMI 50.0-59.9, adult (HCC) 01/16/2021  . Asthma 01/16/2021  . Sinusitis 11/19/2020  . At increased risk of exposure to COVID-19 virus 11/19/2020  . Hyperlipidemia   . Hypothyroidism 02/07/2020  . Mixed hyperlipidemia 02/07/2020  . Essential hypertension 02/07/2020  . GERD with esophagitis 02/07/2020  . 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 Bariatric surgery status 02/07/2020  . Morbid obesity (HCC) 02/07/2020  . Classical migraine 02/07/2020  . Current moderate episode of major depressive disorder without prior episode (HCC) 07/21/2018  . Depression with anxiety 12/08/2017    Current Outpatient Medications on File Prior to Visit  Medication Sig Dispense Refill  . ARIPiprazole (ABILIFY) 10 MG tablet Take 10 mg by mouth daily.    Marland Kitchen doxycycline (VIBRA-TABS) 100 MG tablet Take 1 tablet (100 mg total) by mouth 2 (two) times daily. 20 tablet 0  . fluconazole (DIFLUCAN) 150 MG tablet TAKE 1 TABLET BY MOUTH AS ONE DOSE.    Marland Kitchen ketorolac (TORADOL) 10 MG tablet TAKE ONE TABLET BY MOUTH ONCE DAILY AS NEEDED FOR MIGRAINES 30 tablet 3  . levothyroxine (SYNTHROID) 100 MCG tablet Take 1 tablet (100 mcg total) by mouth daily. 30 tablet 6  . losartan (COZAAR) 100 MG tablet TAKE ONE TABLET BY MOUTH EVERY DAY 90 tablet 2  . omeprazole (PRILOSEC) 40 MG capsule TAKE 1 CAPSULE BY MOUTH ONCE DAILY. 30 capsule 6  . Oxcarbazepine (TRILEPTAL)  300 MG tablet Take 300 mg by mouth 2 (two) times daily.    . pravastatin (PRAVACHOL) 20 MG tablet Take 20 mg by mouth daily.    . propranolol (INDERAL) 80 MG tablet TAKE ONE TABLET BY MOUTH EVERY DAY 30 tablet 6  . rizatriptan (MAXALT) 10 MG tablet TAKE ONE TABLET BY MOUTH AT ONSET OF HEADACHE. MAY REPEAT IN TWO HOURS IF NEEDED. 30 tablet 6  . sertraline (ZOLOFT) 100 MG tablet Take 100 mg by mouth daily.     No current facility-administered medications on file prior to visit.    Allergies  Allergen Reactions  . Cefdinir Hives and Nausea And Vomiting  . Risperidone Other (See Comments)    unknown    Objective:  General: Well developed, nourished, in no acute distress, alert and oriented x3   Dermatology: Skin is warm, dry and supple bilateral.  Left hallux lateral nail bed appears to be clean, dry, with a small amount of hyper granular tissue at this nail fold with some pain to palpation there is also some mild pain to palpation to the medial left hallux nail fold as well with blanchable erythema, minimal edema, minimal clear drainage present. The remaining nails appear unremarkable at this time. There are no other lesions or other signs of infection present.  Neurovascular status: Intact. No lower extremity swelling; No pain with calf compression bilateral.  Musculoskeletal: There is mild tenderness to palpation to the left hallux at the lateral margin.  Assesement and Plan: Problem List  Items Addressed This Visit   None   Visit Diagnoses    Pain around toenail    -  Primary   Ingrown nail       Granuloma of great toe       Toe pain, left          -Examined patient  -Discussed treatment alternatives and plan of care; Explained permanent/temporary nail avulsion and post procedure course to patient.  Patient elects for a repeat procedure of a permanent nail avulsion at the left hallux medial and lateral margins. - After a verbal consent, injected 3 ml of a 50:50 mixture of 2%  plain  lidocaine and 0.5% plain marcaine in a normal hallux block fashion. Next, a  betadine prep was performed. Anesthesia was tested and found to be appropriate.  The offending left hallux medial and lateral nail borders were then incised from the hyponychium to the epinychium. The offending nail border was removed and cleared from the field. The area was curretted for any remaining nail or spicules. Phenol application performed and the area was then flushed with alcohol and dressed with antibiotic cream and a dry sterile dressing. -Patient was instructed to leave the dressing intact for today and begin soaking  in a weak solution of betadine and water tomorrow. Patient was instructed to  soak for 15 minutes each day and apply neosporin and a gauze or bandaid dressing each day. -Patient was instructed to monitor the toe for signs of infection and return to office if toe becomes red, hot or swollen. -Return to office in 2 weeks for nail check or sooner if problems arise.  Asencion Islam, DPM

## 2021-04-01 NOTE — Patient Instructions (Signed)

## 2021-04-12 ENCOUNTER — Other Ambulatory Visit: Payer: Self-pay | Admitting: Legal Medicine

## 2021-04-12 DIAGNOSIS — I10 Essential (primary) hypertension: Secondary | ICD-10-CM

## 2021-04-18 ENCOUNTER — Ambulatory Visit (INDEPENDENT_AMBULATORY_CARE_PROVIDER_SITE_OTHER): Payer: BC Managed Care – PPO | Admitting: Sports Medicine

## 2021-04-18 ENCOUNTER — Encounter: Payer: Self-pay | Admitting: Sports Medicine

## 2021-04-18 ENCOUNTER — Other Ambulatory Visit: Payer: Self-pay

## 2021-04-18 DIAGNOSIS — Z9889 Other specified postprocedural states: Secondary | ICD-10-CM

## 2021-04-18 DIAGNOSIS — M79676 Pain in unspecified toe(s): Secondary | ICD-10-CM

## 2021-04-18 NOTE — Progress Notes (Signed)
Subjective: Erin Henderson is a 45 y.o. female patient returns to office today for follow up evaluation after having repeat nail avulsion on the left hallux medial and lateral borders performed on 04/01/2021. Patient reports that she has pretty much stop soaking at this point since the area is healing well on her left great toe with dry scabbing and no pain.  Patient reports that she is getting a little soreness on the right great toe at the lateral margin and wants me to check that toe as well to make sure she does not need any other procedure repeated. Patient deniesfever/chills/nausea/vomitting/any other related constitutional symptoms at this time.  Patient is assisted by her husband this visit.  Patient Active Problem List   Diagnosis Date Noted  . COVID 02/04/2021  . Arthritis 01/16/2021  . BMI 50.0-59.9, adult (HCC) 01/16/2021  . Asthma 01/16/2021  . Sinusitis 11/19/2020  . At increased risk of exposure to COVID-19 virus 11/19/2020  . Hyperlipidemia   . Hypothyroidism 02/07/2020  . Mixed hyperlipidemia 02/07/2020  . Essential hypertension 02/07/2020  . GERD with esophagitis 02/07/2020  . 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 Bariatric surgery status 02/07/2020  . Morbid obesity (HCC) 02/07/2020  . Classical migraine 02/07/2020  . Current moderate episode of major depressive disorder without prior episode (HCC) 07/21/2018  . Depression with anxiety 12/08/2017    Current Outpatient Medications on File Prior to Visit  Medication Sig Dispense Refill  . ARIPiprazole (ABILIFY) 10 MG tablet Take 10 mg by mouth daily.    Marland Kitchen doxycycline (VIBRA-TABS) 100 MG tablet Take 1 tablet (100 mg total) by mouth 2 (two) times daily. 20 tablet 0  . fluconazole (DIFLUCAN) 150 MG tablet TAKE 1 TABLET BY MOUTH AS ONE DOSE.    Marland Kitchen ketorolac (TORADOL) 10 MG tablet TAKE ONE TABLET BY MOUTH ONCE DAILY AS NEEDED FOR MIGRAINES 30 tablet 3  . levothyroxine (SYNTHROID) 100 MCG tablet Take 1  tablet (100 mcg total) by mouth daily. 30 tablet 6  . losartan (COZAAR) 100 MG tablet TAKE 1 TABLET BY MOUTH ONCE DAILY. 90 tablet 2  . omeprazole (PRILOSEC) 40 MG capsule TAKE 1 CAPSULE BY MOUTH ONCE DAILY. 30 capsule 6  . Oxcarbazepine (TRILEPTAL) 300 MG tablet Take 300 mg by mouth 2 (two) times daily.    . pravastatin (PRAVACHOL) 20 MG tablet TAKE 1 TABLET BY MOUTH ONCE DAILY. 90 tablet 2  . propranolol (INDERAL) 80 MG tablet TAKE ONE TABLET BY MOUTH EVERY DAY 30 tablet 6  . rizatriptan (MAXALT) 10 MG tablet TAKE ONE TABLET BY MOUTH AT ONSET OF HEADACHE. MAY REPEAT IN TWO HOURS IF NEEDED. 30 tablet 6  . sertraline (ZOLOFT) 100 MG tablet Take 100 mg by mouth daily.     No current facility-administered medications on file prior to visit.    Allergies  Allergen Reactions  . Cefdinir Hives and Nausea And Vomiting  . Risperidone Other (See Comments)    unknown    Objective:  General: Well developed, nourished, in no acute distress, alert and oriented x3   Dermatology: Skin is warm, dry and supple bilateral.  Left hallux medial/lateral nail bed appears to be clean, dry, with eschar/scab. (-) Erythema. (-) Edema. (-) serosanguous drainage present.  At the right hallux nail there is evidence of previous avulsion procedure at medial and lateral corners however distally the nail has grown out and appears to be slightly incurvated at the lateral margin but there are no acute signs of infection no significant redness,  warmth, swelling, or active drainage at the right hallux lateral nail fold.  The remaining nails appear unremarkable at this time. There are no other lesions or other signs of infection  present.  Neurovascular status: Intact. No lower extremity swelling; No pain with calf compression bilateral.  Musculoskeletal: No tenderness on the left hallux however there is very mild tenderness at the right hallux at the distal lateral nail fold.  Muscular strength within normal limits bilateral.    Assesement and Plan: Problem List Items Addressed This Visit   None   Visit Diagnoses    Status post nail surgery    -  Primary   Pain of toe, unspecified laterality          -Examined patient  -Applied protective Band-Aid to the left hallux -Area has healed well patient may discontinue soaking -Evaluated right hallux nail there was no acute ingrown nail noted however due to the mild curvature of the distal edge of the right hallux nail at the lateral margin I did decide to trim her nails shorter using a sterile nail nipper without incident  -Educated patient on long term care after nail surgery. -Patient was instructed to monitor toes for reoccurrence and signs of infection; Patient advised to return to office or go to ER if toe becomes red, hot or swollen. -Patient is to return as needed or sooner if problems arise.  Asencion Islam, DPM

## 2021-05-02 ENCOUNTER — Other Ambulatory Visit: Payer: Self-pay | Admitting: Legal Medicine

## 2021-05-15 ENCOUNTER — Ambulatory Visit (INDEPENDENT_AMBULATORY_CARE_PROVIDER_SITE_OTHER): Payer: BC Managed Care – PPO | Admitting: Nurse Practitioner

## 2021-05-15 ENCOUNTER — Encounter: Payer: Self-pay | Admitting: Nurse Practitioner

## 2021-05-15 ENCOUNTER — Other Ambulatory Visit: Payer: Self-pay | Admitting: Nurse Practitioner

## 2021-05-15 VITALS — BP 128/82 | HR 73 | Temp 97.6°F | Ht 65.0 in | Wt 353.0 lb

## 2021-05-15 DIAGNOSIS — R0981 Nasal congestion: Secondary | ICD-10-CM | POA: Diagnosis not present

## 2021-05-15 DIAGNOSIS — J301 Allergic rhinitis due to pollen: Secondary | ICD-10-CM | POA: Diagnosis not present

## 2021-05-15 DIAGNOSIS — J329 Chronic sinusitis, unspecified: Secondary | ICD-10-CM | POA: Diagnosis not present

## 2021-05-15 DIAGNOSIS — J029 Acute pharyngitis, unspecified: Secondary | ICD-10-CM

## 2021-05-15 DIAGNOSIS — R0789 Other chest pain: Secondary | ICD-10-CM

## 2021-05-15 LAB — POC COVID19 BINAXNOW: SARS Coronavirus 2 Ag: NEGATIVE

## 2021-05-15 LAB — POCT RAPID STREP A (OFFICE): Rapid Strep A Screen: NEGATIVE

## 2021-05-15 LAB — POCT INFLUENZA A/B
Influenza A, POC: NEGATIVE
Influenza B, POC: NEGATIVE

## 2021-05-15 MED ORDER — FLUTICASONE PROPIONATE 50 MCG/ACT NA SUSP: 2.0000 | Freq: Every day | NASAL | 6 refills | Status: DC

## 2021-05-15 MED ORDER — ALBUTEROL SULFATE HFA 108 (90 BASE) MCG/ACT IN AERS: 2.0000 | INHALATION_SPRAY | Freq: Four times a day (QID) | RESPIRATORY_TRACT | 0 refills | Status: DC | PRN

## 2021-05-15 MED ORDER — AMOXICILLIN-POT CLAVULANATE 875-125 MG PO TABS: 1.0000 | ORAL_TABLET | Freq: Two times a day (BID) | ORAL | 0 refills | Status: DC

## 2021-05-15 NOTE — Progress Notes (Signed)
Acute Office Visit  Subjective:    Patient ID: Erin Henderson, female    DOB: 05/01/1976, 45 y.o.   MRN: [id number]  Chief Complaint  Patient presents with  . Congestion/Right ear pain    HPI Patient is in today for sinus congestion, sinus pain, post-nasal-drip, right ear pain, and chest tightness. Onset of symptoms was 3-days ago. Treatment has included Claritin 10 mg and Benadryl. She denies cough or dyspnea. She has a past medical history of allergic rhinitis.    Past Medical History:  Diagnosis Date  . GERD with esophagitis   . Hyperlipidemia   . Hypertension   . Hypothyroidism     Past Surgical History:  Procedure Laterality Date  . CHOLECYSTECTOMY    . LAPAROSCOPIC GASTRIC SLEEVE RESECTION    . VAGINAL HYSTERECTOMY      Family History  Problem Relation Age of Onset  . Hypertension Mother   . Hyperlipidemia Mother   . Thyroid disease Mother   . Migraines Mother   . Asthma Father   . Stroke Father   . Diabetes Father   . Hyperlipidemia Father   . Hypertension Father   . Thyroid disease Father   . Hyperlipidemia Sister   . Hypertension Sister     Social History   Socioeconomic History  . Marital status: Married    Spouse name: Not on file  . Number of children: 2  . Years of education: Not on file  . Highest education level: Not on file  Occupational History  . Occupation: school bus driver  Tobacco Use  . Smoking status: Former Smoker    Types: Cigarettes    Quit date: 2000    Years since quitting: 22.4  . Smokeless tobacco: Never Used  Vaping Use  . Vaping Use: Never used  Substance and Sexual Activity  . Alcohol use: Never  . Drug use: Never  . Sexual activity: Not on file  Other Topics Concern  . Not on file  Social History Narrative  . Not on file   Social Determinants of Health   Financial Resource Strain: Not on file  Food Insecurity: Not on file  Transportation Needs: Not on file  Physical Activity: Not on file  Stress: Not on  file  Social Connections: Not on file  Intimate Partner Violence: Not on file    Outpatient Medications Prior to Visit  Medication Sig Dispense Refill  . ARIPiprazole (ABILIFY) 10 MG tablet Take 10 mg by mouth daily.    Marland Kitchen ketorolac (TORADOL) 10 MG tablet TAKE ONE TABLET BY MOUTH ONCE DAILY AS NEEDED FOR MIGRAINES 30 tablet 3  . levothyroxine (SYNTHROID) 100 MCG tablet TAKE 1 TABLET BY MOUTH ONCE DAILY. 30 tablet 6  . losartan (COZAAR) 100 MG tablet TAKE 1 TABLET BY MOUTH ONCE DAILY. 90 tablet 2  . omeprazole (PRILOSEC) 40 MG capsule TAKE 1 CAPSULE BY MOUTH ONCE DAILY. 30 capsule 6  . Oxcarbazepine (TRILEPTAL) 300 MG tablet Take 300 mg by mouth 2 (two) times daily.    . pravastatin (PRAVACHOL) 20 MG tablet TAKE 1 TABLET BY MOUTH ONCE DAILY. 90 tablet 2  . propranolol (INDERAL) 80 MG tablet TAKE ONE TABLET BY MOUTH EVERY DAY 30 tablet 6  . rizatriptan (MAXALT) 10 MG tablet TAKE ONE TABLET BY MOUTH AT ONSET OF HEADACHE. MAY REPEAT IN TWO HOURS IF NEEDED. 30 tablet 6  . sertraline (ZOLOFT) 100 MG tablet Take 100 mg by mouth daily.    Marland Kitchen doxycycline (VIBRA-TABS) 100 MG tablet  Take 1 tablet (100 mg total) by mouth 2 (two) times daily. 20 tablet 0  . fluconazole (DIFLUCAN) 150 MG tablet TAKE 1 TABLET BY MOUTH AS ONE DOSE.     No facility-administered medications prior to visit.    Allergies  Allergen Reactions  . Cefdinir Hives and Nausea And Vomiting  . Risperidone Other (See Comments)    unknown    Review of Systems  Constitutional: Positive for fatigue. Negative for fever.  HENT: Positive for congestion, ear pain (Right), postnasal drip, rhinorrhea, sinus pressure, sinus pain and sore throat.   Eyes: Negative for pain.  Respiratory: Positive for chest tightness. Negative for cough, shortness of breath and wheezing.   Cardiovascular: Negative for chest pain and palpitations.  Gastrointestinal: Negative for abdominal pain, constipation, diarrhea, nausea and vomiting.  Endocrine:  Negative.   Genitourinary: Negative for dysuria and hematuria.  Musculoskeletal: Negative for arthralgias, back pain, joint swelling and myalgias.  Skin: Negative for rash.  Allergic/Immunologic: Positive for environmental allergies.  Neurological: Negative for dizziness, weakness and headaches.  Psychiatric/Behavioral: Negative for dysphoric mood. The patient is not nervous/anxious.        Objective:    Physical Exam Vitals reviewed.  Constitutional:      Appearance: Normal appearance.  HENT:     Head: Normocephalic.     Right Ear: Tympanic membrane normal.     Left Ear: Tympanic membrane normal.     Nose: Nose normal.     Mouth/Throat:     Mouth: Mucous membranes are moist.     Pharynx: Posterior oropharyngeal erythema present.  Cardiovascular:     Rate and Rhythm: Normal rate and regular rhythm.     Pulses: Normal pulses.     Heart sounds: Normal heart sounds.  Pulmonary:     Effort: Pulmonary effort is normal.     Breath sounds: Normal breath sounds.  Abdominal:     General: Bowel sounds are normal.     Palpations: Abdomen is soft.  Musculoskeletal:        General: Normal range of motion.     Cervical back: Tenderness present.  Skin:    General: Skin is warm and dry.     Capillary Refill: Capillary refill takes less than 2 seconds.  Neurological:     General: No focal deficit present.     Mental Status: She is alert and oriented to person, place, and time.  Psychiatric:        Mood and Affect: Mood normal.        Behavior: Behavior normal.        Thought Content: Thought content normal.        Judgment: Judgment normal.     BP 128/82 (BP Location: Left Arm, Patient Position: Sitting)   Pulse 73   Temp 97.6 F (36.4 C) (Temporal)   Ht $R'5\' 5"'qh$  (1.651 m)   Wt (!) 353 lb (160.1 kg)   SpO2 95%   BMI 58.74 kg/m  Wt Readings from Last 3 Encounters:  05/15/21 (!) 353 lb (160.1 kg)  03/11/21 (!) 355 lb (161 kg)  02/04/21 (!) 360 lb (163.3 kg)    Health  Maintenance Due  Topic Date Due  . HIV Screening  Never done  . Hepatitis C Screening  Never done  . PAP SMEAR-Modifier  Never done  . COLONOSCOPY (Pts 45-26yrs Insurance coverage will need to be confirmed)  Never done  . MAMMOGRAM  02/13/2021    There are no preventive care reminders to display  for this patient.   Lab Results  Component Value Date   TSH 2.790 03/11/2021   Lab Results  Component Value Date   WBC 9.4 03/11/2021   HGB 12.3 03/11/2021   HCT 38.7 03/11/2021   MCV 78 (L) 03/11/2021   PLT 241 03/11/2021   Lab Results  Component Value Date   NA 142 03/11/2021   K 3.9 03/11/2021   CO2 23 03/11/2021   GLUCOSE 97 03/11/2021   BUN 11 03/11/2021   CREATININE 0.97 03/11/2021   BILITOT 0.3 03/11/2021   ALKPHOS 133 (H) 03/11/2021   AST 17 03/11/2021   ALT 32 03/11/2021   PROT 6.4 03/11/2021   ALBUMIN 4.2 03/11/2021   CALCIUM 9.1 03/11/2021   EGFR 73 03/11/2021   Lab Results  Component Value Date   CHOL 170 03/11/2021   Lab Results  Component Value Date   HDL 33 (L) 03/11/2021   Lab Results  Component Value Date   LDLCALC 90 03/11/2021   Lab Results  Component Value Date   TRIG 278 (H) 03/11/2021   Lab Results  Component Value Date   CHOLHDL 5.2 (H) 03/11/2021   Lab Results  Component Value Date   HGBA1C 5.1 03/11/2021         Assessment & Plan:   1. Head congestion - POC COVID-19 BinaxNow - POCT Influenza A/B  2. Sore throat - POCT rapid strep A  3. Other sinusitis, unspecified chronicity - amoxicillin-clavulanate (AUGMENTIN) 875-125 MG tablet; Take 1 tablet by mouth 2 (two) times daily.  Dispense: 20 tablet; Refill: 0  4. Seasonal allergic rhinitis due to pollen - fluticasone (FLONASE) 50 MCG/ACT nasal spray; Place 2 sprays into both nostrils daily.  Dispense: 16 g; Refill: 6  5. Chest tightness - albuterol (VENTOLIN HFA) 108 (90 Base) MCG/ACT inhaler; Inhale 2 puffs into the lungs every 6 (six) hours as needed for wheezing or  shortness of breath.  Dispense: 8 g; Refill: 0     Take Augmentin twice daily for 10 days Add Flonase nasal spray daily for allergic rhinitis Continue Benadryl and Claritin for allergic rhinitis Remove laundry basket from bedroom Use Albuterol inhaler as needed for shortness of breath as needed Push fluids, especially water Notify office if symptoms worsen or fail to improve   I, Lauren Peterson [place] as a scribe for [employer], NP.,have documented all relevant documentation on the behalf of Rip Harbour, NP,as directed by  Rip Harbour, NP while in the presence of Rip Harbour, NP. Oleta Mouse, CMA   I, Rip Harbour, NP, have reviewed all documentation for this visit. The documentation on 05/15/21 for the exam, diagnosis, procedures, and orders are all accurate and complete.  Follow-up: as needed  Signed,  Jerrell Belfast, DNP

## 2021-05-15 NOTE — Patient Instructions (Signed)
Take Augmentin twice daily for 10 days Add Flonase nasal spray daily for allergic rhinitis Continue Benadryl and Claritin for allergic rhinitis Remove laundry basket from bedroom Use Albuterol inhaler as needed for shortness of breath as needed Push fluids, especially water Notify office if symptoms worsen or fail to improve  Sinusitis, Adult Sinusitis is soreness and swelling (inflammation) of your sinuses. Sinuses are hollow spaces in the bones around your face. They are located:  Around your eyes.  In the middle of your forehead.  Behind your nose.  In your cheekbones. Your sinuses and nasal passages are lined with a fluid called mucus. Mucus drains out of your sinuses. Swelling can trap mucus in your sinuses. This lets germs (bacteria, virus, or fungus) grow, which leads to infection. Most of the time, this condition is caused by a virus. What are the causes? This condition is caused by:  Allergies.  Asthma.  Germs.  Things that block your nose or sinuses.  Growths in the nose (nasal polyps).  Chemicals or irritants in the air.  Fungus (rare). What increases the risk? You are more likely to develop this condition if:  You have a weak body defense system (immune system).  You do a lot of swimming or diving.  You use nasal sprays too much.  You smoke. What are the signs or symptoms? The main symptoms of this condition are pain and a feeling of pressure around the sinuses. Other symptoms include:  Stuffy nose (congestion).  Runny nose (drainage).  Swelling and warmth in the sinuses.  Headache.  Toothache.  A cough that may get worse at night.  Mucus that collects in the throat or the back of the nose (postnasal drip).  Being unable to smell and taste.  Being very tired (fatigue).  A fever.  Sore throat.  Bad breath. How is this diagnosed? This condition is diagnosed based on:  Your symptoms.  Your medical history.  A physical  exam.  Tests to find out if your condition is short-term (acute) or long-term (chronic). Your doctor may: ? Check your nose for growths (polyps). ? Check your sinuses using a tool that has a light (endoscope). ? Check for allergies or germs. ? Do imaging tests, such as an MRI or CT scan. How is this treated? Treatment for this condition depends on the cause and whether it is short-term or long-term.  If caused by a virus, your symptoms should go away on their own within 10 days. You may be given medicines to relieve symptoms. They include: ? Medicines that shrink swollen tissue in the nose. ? Medicines that treat allergies (antihistamines). ? A spray that treats swelling of the nostrils. ? Rinses that help get rid of thick mucus in your nose (nasal saline washes).  If caused by bacteria, your doctor may wait to see if you will get better without treatment. You may be given antibiotic medicine if you have: ? A very bad infection. ? A weak body defense system.  If caused by growths in the nose, you may need to have surgery. Follow these instructions at home: Medicines  Take, use, or apply over-the-counter and prescription medicines only as told by your doctor. These may include nasal sprays.  If you were prescribed an antibiotic medicine, take it as told by your doctor. Do not stop taking the antibiotic even if you start to feel better. Hydrate and humidify  Drink enough water to keep your pee (urine) pale yellow.  Use a cool mist humidifier  to keep the humidity level in your home above 50%.  Breathe in steam for 10-15 minutes, 3-4 times a day, or as told by your doctor. You can do this in the bathroom while a hot shower is running.  Try not to spend time in cool or dry air.   Rest  Rest as much as you can.  Sleep with your head raised (elevated).  Make sure you get enough sleep each night. General instructions  Put a warm, moist washcloth on your face 3-4 times a day, or  as often as told by your doctor. This will help with discomfort.  Wash your hands often with soap and water. If there is no soap and water, use hand sanitizer.  Do not smoke. Avoid being around people who are smoking (secondhand smoke).  Keep all follow-up visits as told by your doctor. This is important.   Contact a doctor if:  You have a fever.  Your symptoms get worse.  Your symptoms do not get better within 10 days. Get help right away if:  You have a very bad headache.  You cannot stop throwing up (vomiting).  You have very bad pain or swelling around your face or eyes.  You have trouble seeing.  You feel confused.  Your neck is stiff.  You have trouble breathing. Summary  Sinusitis is swelling of your sinuses. Sinuses are hollow spaces in the bones around your face.  This condition is caused by tissues in your nose that become inflamed or swollen. This traps germs. These can lead to infection.  If you were prescribed an antibiotic medicine, take it as told by your doctor. Do not stop taking it even if you start to feel better.  Keep all follow-up visits as told by your doctor. This is important. This information is not intended to replace advice given to you by your health care provider. Make sure you discuss any questions you have with your health care provider. Document Revised: 05/09/2018 Document Reviewed: 05/09/2018 Elsevier Patient Education  2021 [employer] Allergic Rhinitis, Adult Allergic rhinitis is a reaction to allergens. Allergens are things that can cause an allergic reaction. This condition affects the lining inside the nose (mucous membrane). There are two types of allergic rhinitis:  Seasonal. This type is also called hay fever. It happens only during some times of the year.  Perennial. This type can happen at any time of the year. This condition cannot be spread from person to person (is not contagious). It can be mild, worse, or very bad.  It can develop at any age and may be outgrown. What are the causes? This condition may be caused by:  Pollen from grasses, trees, and weeds.  Dust mites.  Smoke.  Mold.  Car fumes.  The pee (urine), spit, or dander of pets. Dander is dead skin cells from a pet.   What increases the risk? You are more likely to develop this condition if:  You have allergies in your family.  You have problems like allergies in your family. You may have: ? Swelling of parts of your eyes and eyelids. ? Asthma. This affects how you breathe. ? Long-term redness and swelling on your skin. ? Food allergies. What are the signs or symptoms? The main symptom of this condition is a runny or stuffy nose (nasal congestion). Other symptoms may include:  Sneezing or coughing.  Itching and tearing of your eyes.  Mucus that drips down the back of your throat (postnasal drip).  Trouble sleeping.  Feeling tired.  Headache.  Sore throat. How is this treated? There is no cure for this condition. You should avoid things that you are allergic to. Treatment can help to relieve symptoms. This may include:  Medicines that block allergy symptoms, such as corticosteroids or antihistamines. These may be given as a shot, nasal spray, or pill.  Avoiding things you are allergic to.  Medicines that give you bits of what you are allergic to over time. This is called immunotherapy. It is done if other treatments do not help. You may get: ? Shots. ? Medicine under your tongue.  Stronger medicines, if other treatments do not help. Follow these instructions at home: Avoiding allergens Find out what things you are allergic to and avoid them. To do this, try these things:  If you get allergies any time of year: ? Replace carpet with wood, tile, or vinyl flooring. Carpet can trap pet dander and dust. ? Do not smoke. Do not allow smoking in your home. ? Change your heating and air conditioning filters at least once  a month.  If you get allergies only some times of the year: ? Keep windows closed when you can. ? Plan things to do outside when pollen counts are lowest. Check pollen counts before you plan things to do outside. ? When you come indoors, change your clothes and shower before you sit on furniture or bedding.   If you are allergic to a pet: ? Keep the pet out of your bedroom. ? Vacuum, sweep, and dust often.   General instructions  Take over-the-counter and prescription medicines only as told by your doctor.  Drink enough fluid to keep your pee (urine) pale yellow.  Keep all follow-up visits as told by your doctor. This is important. Where to find more information  American Academy of Allergy, Asthma & Immunology: www.aaaai.org Contact a doctor if:  You have a fever.  You get a cough that does not go away.  You make whistling sounds when you breathe (wheeze).  Your symptoms slow you down.  Your symptoms stop you from doing your normal things each day. Get help right away if:  You are short of breath. This symptom may be an emergency. Do not wait to see if the symptom will go away. Get medical help right away. Call your local emergency services (911 in the [place] Do not drive yourself to the hospital. Summary  Allergic rhinitis may be treated by taking medicines and avoiding things you are allergic to.  If you have allergies only some of the year, keep windows closed when you can at those times.  Contact your doctor if you get a fever or a cough that does not go away. This information is not intended to replace advice given to you by your health care provider. Make sure you discuss any questions you have with your health care provider. Document Revised: 01/29/2020 Document Reviewed: 12/05/2019 Elsevier Patient Education  2021 [employer].

## 2021-05-23 ENCOUNTER — Ambulatory Visit (INDEPENDENT_AMBULATORY_CARE_PROVIDER_SITE_OTHER): Payer: BC Managed Care – PPO | Admitting: Nurse Practitioner

## 2021-05-23 ENCOUNTER — Encounter: Payer: Self-pay | Admitting: Nurse Practitioner

## 2021-05-23 ENCOUNTER — Other Ambulatory Visit: Payer: Self-pay

## 2021-05-23 VITALS — BP 138/78 | HR 70 | Temp 97.9°F | Ht 65.0 in | Wt 353.0 lb

## 2021-05-23 DIAGNOSIS — R1033 Periumbilical pain: Secondary | ICD-10-CM | POA: Diagnosis not present

## 2021-05-23 DIAGNOSIS — R1031 Right lower quadrant pain: Secondary | ICD-10-CM

## 2021-05-23 DIAGNOSIS — Z8619 Personal history of other infectious and parasitic diseases: Secondary | ICD-10-CM | POA: Diagnosis not present

## 2021-05-23 DIAGNOSIS — N3000 Acute cystitis without hematuria: Secondary | ICD-10-CM | POA: Diagnosis not present

## 2021-05-23 LAB — POCT URINALYSIS DIPSTICK
Bilirubin, UA: NEGATIVE
Blood, UA: NEGATIVE
Glucose, UA: NEGATIVE
Ketones, UA: NEGATIVE
Nitrite, UA: NEGATIVE
Protein, UA: NEGATIVE
Spec Grav, UA: 1.015 (ref 1.010–1.025)
Urobilinogen, UA: NEGATIVE E.U./dL — AB
pH, UA: 6 (ref 5.0–8.0)

## 2021-05-23 MED ORDER — NITROFURANTOIN MONOHYD MACRO 100 MG PO CAPS: 100.0000 mg | ORAL_CAPSULE | Freq: Two times a day (BID) | ORAL | 0 refills | Status: DC

## 2021-05-23 MED ORDER — FLUCONAZOLE 150 MG PO TABS: 150.0000 mg | ORAL_TABLET | Freq: Once | ORAL | 0 refills | Status: AC

## 2021-05-23 NOTE — Patient Instructions (Signed)
Take Macrobid 100 mg twice daily for 7 days Push fluids, especially water Use Dove bar soap for bathing Do not hold urine when you feel the urge to void Take Diflucan for vaginal yeast infection Seek emergency medical care immediately if abdominal pain worsens Continue Ibuprofen for abd pain as directed Follow-up as needed  Urinary Tract Infection, Adult A urinary tract infection (UTI) is an infection of any part of the urinary tract. The urinary tract includes:  The kidneys.  The ureters.  The bladder.  The urethra. These organs make, store, and get rid of pee (urine) in the body. What are the causes? This infection is caused by germs (bacteria) in your genital area. These germs grow and cause swelling (inflammation) of your urinary tract. What increases the risk? The following factors may make you more likely to develop this condition:  Using a small, thin tube (catheter) to drain pee.  Not being able to control when you pee or poop (incontinence).  Being female. If you are female, these things can increase the risk: ? Using these methods to prevent pregnancy:  A medicine that kills sperm (spermicide).  A device that blocks sperm (diaphragm). ? Having low levels of a female hormone (estrogen). ? Being pregnant. You are more likely to develop this condition if:  You have genes that add to your risk.  You are sexually active.  You take antibiotic medicines.  You have trouble peeing because of: ? A prostate that is bigger than normal, if you are female. ? A blockage in the part of your body that drains pee from the bladder. ? A kidney stone. ? A nerve condition that affects your bladder. ? Not getting enough to drink. ? Not peeing often enough.  You have other conditions, such as: ? Diabetes. ? A weak disease-fighting system (immune system). ? Sickle cell disease. ? Gout. ? Injury of the spine. What are the signs or symptoms? Symptoms of this condition  include:  Needing to pee right away.  Peeing small amounts often.  Pain or burning when peeing.  Blood in the pee.  Pee that smells bad or not like normal.  Trouble peeing.  Pee that is cloudy.  Fluid coming from the vagina, if you are female.  Pain in the belly or lower back. Other symptoms include:  Vomiting.  Not feeling hungry.  Feeling mixed up (confused). This may be the first symptom in older adults.  Being tired and grouchy (irritable).  A fever.  Watery poop (diarrhea). How is this treated?  Taking antibiotic medicine.  Taking other medicines.  Drinking enough water. In some cases, you may need to see a specialist. Follow these instructions at home: Medicines  Take over-the-counter and prescription medicines only as told by your doctor.  If you were prescribed an antibiotic medicine, take it as told by your doctor. Do not stop taking it even if you start to feel better. General instructions  Make sure you: ? Pee until your bladder is empty. ? Do not hold pee for a long time. ? Empty your bladder after sex. ? Wipe from front to back after peeing or pooping if you are a female. Use each tissue one time when you wipe.  Drink enough fluid to keep your pee pale yellow.  Keep all follow-up visits.   Contact a doctor if:  You do not get better after 1-2 days.  Your symptoms go away and then come back. Get help right away if:  You have  very bad back pain.  You have very bad pain in your lower belly.  You have a fever.  You have chills.  You feeling like you will vomit or you vomit. Summary  A urinary tract infection (UTI) is an infection of any part of the urinary tract.  This condition is caused by germs in your genital area.  There are many risk factors for a UTI.  Treatment includes antibiotic medicines.  Drink enough fluid to keep your pee pale yellow. This information is not intended to replace advice given to you by your  health care provider. Make sure you discuss any questions you have with your health care provider. Document Revised: 07/19/2020 Document Reviewed: 07/19/2020 Elsevier Patient Education  2021 [employer].

## 2021-05-23 NOTE — Progress Notes (Signed)
Acute Office Visit  Subjective:    Patient ID: Erin Henderson, female    DOB: 04/20/1976, 45 y.o.   MRN: [id number]  Chief Complaint  Patient presents with  . RLQ abdominal pain    X3 days    HPI Patient is in today for periumbilical pain  Abdominal Pain  She reports new onset abdominal pain. The most recent episode started a few days ago and is staying constant. The abdominal pain is located in the periumbilical region with radiation to the right lower quadrant. It is described as sharp, is 4/10 in intensity, occurring intermittently. It is aggravated by moving and is relieved by Ibuprofen. She has tried NSAIDs with moderate relief.  Associated symptoms: No anorexia  No belching  No bloody stool No blood in urine   No constipation Yes diarrhea  No dysuria No fever  No flatus Yes headaches  No headaches No joint pains  No myalgias No nausea  No vomiting No weight loss     Recent GI studies:none Relevant medical history includes: none  Previous labs Lab Results  Component Value Date   WBC 9.4 03/11/2021   HGB 12.3 03/11/2021   HCT 38.7 03/11/2021   MCV 78 (L) 03/11/2021   MCH 24.8 (L) 03/11/2021   RDW 14.5 03/11/2021   PLT 241 03/11/2021   Lab Results  Component Value Date   GLUCOSE 97 03/11/2021   NA 142 03/11/2021   K 3.9 03/11/2021   CL 99 03/11/2021   CO2 23 03/11/2021   BUN 11 03/11/2021   CREATININE 0.97 03/11/2021   GFRNONAA 76 09/02/2020   GFRAA 88 09/02/2020   CALCIUM 9.1 03/11/2021   PROT 6.4 03/11/2021   ALBUMIN 4.2 03/11/2021   LABGLOB 2.2 03/11/2021   AGRATIO 1.9 03/11/2021   BILITOT 0.3 03/11/2021   ALKPHOS 133 (H) 03/11/2021   AST 17 03/11/2021   ALT 32 03/11/2021     Past Medical History:  Diagnosis Date  . GERD with esophagitis   . Hyperlipidemia   . Hypertension   . Hypothyroidism     Past Surgical History:  Procedure Laterality Date  . CHOLECYSTECTOMY    . LAPAROSCOPIC GASTRIC SLEEVE RESECTION    . VAGINAL HYSTERECTOMY       Family History  Problem Relation Age of Onset  . Hypertension Mother   . Hyperlipidemia Mother   . Thyroid disease Mother   . Migraines Mother   . Asthma Father   . Stroke Father   . Diabetes Father   . Hyperlipidemia Father   . Hypertension Father   . Thyroid disease Father   . Hyperlipidemia Sister   . Hypertension Sister     Social History   Socioeconomic History  . Marital status: Married    Spouse name: Not on file  . Number of children: 2  . Years of education: Not on file  . Highest education level: Not on file  Occupational History  . Occupation: school bus driver  Tobacco Use  . Smoking status: Former Smoker    Types: Cigarettes    Quit date: 2000    Years since quitting: 22.4  . Smokeless tobacco: Never Used  Vaping Use  . Vaping Use: Never used  Substance and Sexual Activity  . Alcohol use: Never  . Drug use: Never  . Sexual activity: Not on file  Other Topics Concern  . Not on file  Social History Narrative  . Not on file      Outpatient Medications Prior  to Visit  Medication Sig Dispense Refill  . albuterol (VENTOLIN HFA) 108 (90 Base) MCG/ACT inhaler Inhale 2 puffs into the lungs every 6 (six) hours as needed for wheezing or shortness of breath. 8 g 0  . amoxicillin-clavulanate (AUGMENTIN) 875-125 MG tablet Take 1 tablet by mouth 2 (two) times daily. 20 tablet 0  . ARIPiprazole (ABILIFY) 10 MG tablet Take 10 mg by mouth daily.    . fluticasone (FLONASE) 50 MCG/ACT nasal spray Place 2 sprays into both nostrils daily. 16 g 6  . ketorolac (TORADOL) 10 MG tablet TAKE ONE TABLET BY MOUTH ONCE DAILY AS NEEDED FOR MIGRAINES 30 tablet 3  . levothyroxine (SYNTHROID) 100 MCG tablet TAKE 1 TABLET BY MOUTH ONCE DAILY. 30 tablet 6  . losartan (COZAAR) 100 MG tablet TAKE 1 TABLET BY MOUTH ONCE DAILY. 90 tablet 2  . omeprazole (PRILOSEC) 40 MG capsule TAKE 1 CAPSULE BY MOUTH ONCE DAILY. 30 capsule 6  . Oxcarbazepine (TRILEPTAL) 300 MG tablet Take 300 mg  by mouth 2 (two) times daily.    . pravastatin (PRAVACHOL) 20 MG tablet TAKE 1 TABLET BY MOUTH ONCE DAILY. 90 tablet 2  . propranolol (INDERAL) 80 MG tablet TAKE ONE TABLET BY MOUTH EVERY DAY 30 tablet 6  . rizatriptan (MAXALT) 10 MG tablet TAKE ONE TABLET BY MOUTH AT ONSET OF HEADACHE. MAY REPEAT IN TWO HOURS IF NEEDED. 30 tablet 6  . sertraline (ZOLOFT) 100 MG tablet Take 100 mg by mouth daily.     No facility-administered medications prior to visit.    Allergies  Allergen Reactions  . Cefdinir Hives and Nausea And Vomiting  . Risperidone Other (See Comments)    unknown    Review of Systems  Constitutional: Negative for fatigue and fever.  HENT: Negative for congestion, ear pain, sinus pressure and sore throat.   Eyes: Negative for pain.  Respiratory: Negative for cough, chest tightness, shortness of breath and wheezing.   Cardiovascular: Negative for chest pain and palpitations.  Gastrointestinal: Positive for abdominal pain (RLQ). Negative for constipation, diarrhea, nausea and vomiting.  Endocrine: Negative.   Genitourinary: Negative for dysuria and hematuria.  Musculoskeletal: Negative for arthralgias, back pain, joint swelling and myalgias.  Skin: Negative for rash.  Allergic/Immunologic: Positive for environmental allergies.  Neurological: Positive for headaches. Negative for dizziness and weakness.  Psychiatric/Behavioral: Negative for dysphoric mood. The patient is not nervous/anxious.        Objective:    Physical Exam Vitals reviewed.  Constitutional:      Appearance: Normal appearance. She is obese.  HENT:     Head: Normocephalic.     Nose: Nose normal.     Mouth/Throat:     Mouth: Mucous membranes are moist.  Cardiovascular:     Rate and Rhythm: Normal rate and regular rhythm.     Pulses: Normal pulses.     Heart sounds: Normal heart sounds.  Pulmonary:     Effort: Pulmonary effort is normal.     Breath sounds: Normal breath sounds.  Abdominal:      General: Bowel sounds are normal.     Palpations: Abdomen is soft.     Tenderness: There is abdominal tenderness in the right lower quadrant and periumbilical area. There is no guarding or rebound.  Skin:    General: Skin is warm and dry.  Neurological:     Mental Status: She is alert and oriented to person, place, and time.  Psychiatric:        Mood and Affect:  Mood normal.        Behavior: Behavior normal.        Thought Content: Thought content normal.        Judgment: Judgment normal.     BP 138/78 (BP Location: Left Arm, Patient Position: Sitting)   Pulse 70   Temp 97.9 F (36.6 C) (Temporal)   Ht $R'5\' 5"'oc$  (1.651 m)   Wt (!) 353 lb (160.1 kg)   SpO2 98%   BMI 58.74 kg/m  Wt Readings from Last 3 Encounters:  05/23/21 (!) 353 lb (160.1 kg)  05/15/21 (!) 353 lb (160.1 kg)  03/11/21 (!) 355 lb (161 kg)    Health Maintenance Due  Topic Date Due  . Pneumococcal Vaccine 98-103 Years old (1 of 2 - PPSV23) Never done  . HIV Screening  Never done  . Hepatitis C Screening  Never done  . PAP SMEAR-Modifier  Never done  . COLONOSCOPY (Pts 45-56yrs Insurance coverage will need to be confirmed)  Never done  . MAMMOGRAM  02/13/2021     Lab Results  Component Value Date   TSH 2.790 03/11/2021   Lab Results  Component Value Date   WBC 9.4 03/11/2021   HGB 12.3 03/11/2021   HCT 38.7 03/11/2021   MCV 78 (L) 03/11/2021   PLT 241 03/11/2021   Lab Results  Component Value Date   NA 142 03/11/2021   K 3.9 03/11/2021   CO2 23 03/11/2021   GLUCOSE 97 03/11/2021   BUN 11 03/11/2021   CREATININE 0.97 03/11/2021   BILITOT 0.3 03/11/2021   ALKPHOS 133 (H) 03/11/2021   AST 17 03/11/2021   ALT 32 03/11/2021   PROT 6.4 03/11/2021   ALBUMIN 4.2 03/11/2021   CALCIUM 9.1 03/11/2021   EGFR 73 03/11/2021   Lab Results  Component Value Date   CHOL 170 03/11/2021   Lab Results  Component Value Date   HDL 33 (L) 03/11/2021   Lab Results  Component Value Date   LDLCALC 90  03/11/2021   Lab Results  Component Value Date   TRIG 278 (H) 03/11/2021   Lab Results  Component Value Date   CHOLHDL 5.2 (H) 03/11/2021   Lab Results  Component Value Date   HGBA1C 5.1 03/11/2021         Assessment & Plan:   1. Acute cystitis without hematuria - nitrofurantoin, macrocrystal-monohydrate, (MACROBID) 100 MG capsule; Take 1 capsule (100 mg total) by mouth 2 (two) times daily.  Dispense: 14 capsule; Refill: 0 - Urine Culture  2. Acute abdominal pain in right lower quadrant - POCT urinalysis dipstick  3. Umbilical pain -Ibuprofen as directed  4. History of candidiasis of vagina - fluconazole (DIFLUCAN) 150 MG tablet; Take 1 tablet (150 mg total) by mouth once for 1 dose.  Dispense: 1 tablet; Refill: 0   Take Macrobid 100 mg twice daily for 7 days Push fluids, especially water Use Dove bar soap for bathing Do not hold urine when you feel the urge to void Take Diflucan for vaginal yeast infection Seek emergency medical care immediately if abdominal pain worsens Continue Ibuprofen for abd pain as directed Follow-up as needed    I , Lauren Peterson [place] as a scribe for [employer], NP.,have documented all relevant documentation on the behalf of Rip Harbour, NP,as directed by  Rip Harbour, NP while in the presence of Rip Harbour, NP.  I, Rip Harbour, NP, have reviewed all documentation for this visit. The documentation  on 05/23/21 for the exam, diagnosis, procedures, and orders are all accurate and complete.  Follow-up: As needed   Signed, Jerrell Belfast, DNP

## 2021-05-25 LAB — URINE CULTURE: Organism ID, Bacteria: NO GROWTH

## 2021-07-03 ENCOUNTER — Encounter: Payer: Self-pay | Admitting: Nurse Practitioner

## 2021-07-03 ENCOUNTER — Other Ambulatory Visit: Payer: Self-pay

## 2021-07-03 ENCOUNTER — Ambulatory Visit (INDEPENDENT_AMBULATORY_CARE_PROVIDER_SITE_OTHER): Payer: BC Managed Care – PPO | Admitting: Nurse Practitioner

## 2021-07-03 VITALS — BP 138/84 | HR 82 | Temp 97.3°F | Ht 65.0 in | Wt 375.0 lb

## 2021-07-03 DIAGNOSIS — Z8619 Personal history of other infectious and parasitic diseases: Secondary | ICD-10-CM

## 2021-07-03 DIAGNOSIS — N3000 Acute cystitis without hematuria: Secondary | ICD-10-CM | POA: Diagnosis not present

## 2021-07-03 DIAGNOSIS — R1031 Right lower quadrant pain: Secondary | ICD-10-CM | POA: Diagnosis not present

## 2021-07-03 DIAGNOSIS — K5909 Other constipation: Secondary | ICD-10-CM | POA: Diagnosis not present

## 2021-07-03 LAB — POCT URINALYSIS DIPSTICK
Blood, UA: NEGATIVE
Glucose, UA: NEGATIVE
Ketones, UA: NEGATIVE
Nitrite, UA: NEGATIVE
Protein, UA: POSITIVE — AB
Spec Grav, UA: 1.025 (ref 1.010–1.025)
Urobilinogen, UA: NEGATIVE E.U./dL — AB
pH, UA: 6 (ref 5.0–8.0)

## 2021-07-03 MED ORDER — NITROFURANTOIN MONOHYD MACRO 100 MG PO CAPS: 100.0000 mg | ORAL_CAPSULE | Freq: Two times a day (BID) | ORAL | 0 refills | Status: AC

## 2021-07-03 MED ORDER — FLUCONAZOLE 150 MG PO TABS: 150.0000 mg | ORAL_TABLET | Freq: Once | ORAL | 0 refills | Status: AC

## 2021-07-03 NOTE — Progress Notes (Signed)
Acute Office Visit  Subjective:    Patient ID: Erin Henderson, female    DOB: 12-Jun-1976, 45 y.o.   MRN: [id number]  Chief Complaint  Patient presents with   Constipation    Constipation Associated symptoms include abdominal pain.  Affie isa 45 year old Caucasian female that presents with RLQ abd pain. Onset was 9-monthago. [facility] of abd and pelvis with contrast revealed hepatic steatosis and mild aortic atherosclerosis. U/A at ED visit showed 2+ bacteria. She denies frequency, urgency, or dysuria. She states ED physician stated abd pain was caused by constipation, recommended Miralax daily. JDarrahstates she has been adherent to taking Miralax daily.   Follow up ER visit  Patient was seen in ER for abdominal pain on 06/21/21. She was treated for constipation. Treatment for this included Miralax. She reports excellent compliance with treatment. She reports this condition is Unchanged.         Past Medical History:  Diagnosis Date   GERD with esophagitis    Hyperlipidemia    Hypertension    Hypothyroidism     Past Surgical History:  Procedure Laterality Date   CHOLECYSTECTOMY     LAPAROSCOPIC GASTRIC SLEEVE RESECTION     VAGINAL HYSTERECTOMY      Family History  Problem Relation Age of Onset   Hypertension Mother    Hyperlipidemia Mother    Thyroid disease Mother    Migraines Mother    Asthma Father    Stroke Father    Diabetes Father    Hyperlipidemia Father    Hypertension Father    Thyroid disease Father    Hyperlipidemia Sister    Hypertension Sister     Social History   Socioeconomic History   Marital status: Married    Spouse name: Not on file   Number of children: 2   Years of education: Not on file   Highest education level: Not on file  Occupational History   Occupation: school bus driver  Tobacco Use   Smoking status: Former    Types: Cigarettes    Quit date: 2000    Years  since quitting: 22.5   Smokeless tobacco: Never  Vaping Use   Vaping Use: Never used  Substance and Sexual Activity   Alcohol use: Never   Drug use: Never   Sexual activity: Not on file  Other Topics Concern   Not on file  Social History Narrative   Not on file   Social Determinants of Health   Financial Resource Strain: Not on file  Food Insecurity: Not on file  Transportation Needs: Not on file  Physical Activity: Not on file  Stress: Not on file  Social Connections: Not on file  Intimate Partner Violence: Not on file    Outpatient Medications Prior to Visit  Medication Sig Dispense Refill   albuterol (VENTOLIN HFA) 108 (90 Base) MCG/ACT inhaler Inhale 2 puffs into the lungs every 6 (six) hours as needed for wheezing or shortness of breath. 8 g 0   ARIPiprazole (ABILIFY) 10 MG tablet Take 10 mg by mouth daily.     fluticasone (FLONASE) 50 MCG/ACT nasal spray Place 2 sprays into both nostrils daily. 16 g 6   ketorolac (TORADOL) 10 MG tablet TAKE ONE TABLET BY MOUTH ONCE DAILY AS NEEDED FOR MIGRAINES 30 tablet 3   levothyroxine (SYNTHROID) 100 MCG tablet TAKE 1 TABLET BY MOUTH ONCE DAILY. 30 tablet 6   losartan (COZAAR) 100 MG tablet  TAKE 1 TABLET BY MOUTH ONCE DAILY. 90 tablet 2   omeprazole (PRILOSEC) 40 MG capsule TAKE 1 CAPSULE BY MOUTH ONCE DAILY. 30 capsule 6   Oxcarbazepine (TRILEPTAL) 300 MG tablet Take 300 mg by mouth 2 (two) times daily.     pravastatin (PRAVACHOL) 20 MG tablet TAKE 1 TABLET BY MOUTH ONCE DAILY. 90 tablet 2   propranolol (INDERAL) 80 MG tablet TAKE ONE TABLET BY MOUTH EVERY DAY 30 tablet 6   rizatriptan (MAXALT) 10 MG tablet TAKE ONE TABLET BY MOUTH AT ONSET OF HEADACHE. MAY REPEAT IN TWO HOURS IF NEEDED. 30 tablet 6   sertraline (ZOLOFT) 100 MG tablet Take 100 mg by mouth daily.     amoxicillin-clavulanate (AUGMENTIN) 875-125 MG tablet Take 1 tablet by mouth 2 (two) times daily. 20 tablet 0   nitrofurantoin, macrocrystal-monohydrate, (MACROBID) 100  MG capsule Take 1 capsule (100 mg total) by mouth 2 (two) times daily. 14 capsule 0   No facility-administered medications prior to visit.    Allergies  Allergen Reactions   Cefdinir Hives and Nausea And Vomiting   Risperidone Other (See Comments)    unknown    Review of Systems  Constitutional:  Positive for chills and fatigue.  Gastrointestinal:  Positive for abdominal pain and constipation.  All other systems reviewed and are negative.     Objective:    Physical Exam Vitals reviewed.  Constitutional:      Appearance: She is obese.  HENT:     Head: Normocephalic.  Cardiovascular:     Rate and Rhythm: Normal rate and regular rhythm.     Pulses: Normal pulses.     Heart sounds: Normal heart sounds.  Pulmonary:     Effort: Pulmonary effort is normal.  Abdominal:     General: Bowel sounds are normal.     Palpations: Abdomen is soft.     Tenderness: There is abdominal tenderness (RLQ). There is no right CVA tenderness, left CVA tenderness, guarding or rebound.     Hernia: No hernia is present.  Skin:    General: Skin is warm and dry.     Capillary Refill: Capillary refill takes less than 2 seconds.  Neurological:     General: No focal deficit present.     Mental Status: She is alert and oriented to person, place, and time.    BP 138/84 (BP Location: Left Arm, Patient Position: Sitting, Cuff Size: Normal)   Pulse 82   Temp (!) 97.3 F (36.3 C) (Temporal)   Ht 5' 5"  (1.651 m)   Wt (!) 375 lb (170.1 kg)   SpO2 93%   BMI 62.40 kg/m  Wt Readings from Last 3 Encounters:  07/03/21 (!) 375 lb (170.1 kg)  05/23/21 (!) 353 lb (160.1 kg)  05/15/21 (!) 353 lb (160.1 kg)    Health Maintenance Due  Topic Date Due   HIV Screening  Never done   Hepatitis C Screening  Never done   PAP SMEAR-Modifier  Never done   COLONOSCOPY (Pts 45-91yr Insurance coverage will need to be confirmed)  Never done   MAMMOGRAM  02/13/2021     Lab Results  Component Value Date   TSH  2.790 03/11/2021   Lab Results  Component Value Date   WBC 9.4 03/11/2021   HGB 12.3 03/11/2021   HCT 38.7 03/11/2021   MCV 78 (L) 03/11/2021   PLT 241 03/11/2021   Lab Results  Component Value Date   NA 142 03/11/2021   K 3.9 03/11/2021  CO2 23 03/11/2021   GLUCOSE 97 03/11/2021   BUN 11 03/11/2021   CREATININE 0.97 03/11/2021   BILITOT 0.3 03/11/2021   ALKPHOS 133 (H) 03/11/2021   AST 17 03/11/2021   ALT 32 03/11/2021   PROT 6.4 03/11/2021   ALBUMIN 4.2 03/11/2021   CALCIUM 9.1 03/11/2021   EGFR 73 03/11/2021   Lab Results  Component Value Date   CHOL 170 03/11/2021   Lab Results  Component Value Date   HDL 33 (L) 03/11/2021   Lab Results  Component Value Date   LDLCALC 90 03/11/2021   Lab Results  Component Value Date   TRIG 278 (H) 03/11/2021   Lab Results  Component Value Date   CHOLHDL 5.2 (H) 03/11/2021   Lab Results  Component Value Date   HGBA1C 5.1 03/11/2021       Assessment & Plan:   1. RLQ abdominal pain - Ambulatory referral to Gastroenterology  2. Other constipation - Ambulatory referral to Gastroenterology  3. Acute cystitis without hematuria - nitrofurantoin, macrocrystal-monohydrate, (MACROBID) 100 MG capsule; Take 1 capsule (100 mg total) by mouth 2 (two) times daily for 7 days.  Dispense: 14 capsule; Refill: 0  4. History of candidiasis of vagina - fluconazole (DIFLUCAN) 150 MG tablet; Take 1 tablet (150 mg total) by mouth once for 1 dose.  Dispense: 1 tablet; Refill: 0     Continue Miralax as directed We will call you referral appt to Dr Melina Copa Push fluids, especially water Take Macrobid twice daily for 7 days Follow-up as needed    Follow-up: As needed  Signed, Rip Harbour, NP

## 2021-07-03 NOTE — Patient Instructions (Addendum)
Continue Miralax as directed We will call you referral appt to Dr Charm BargesButler Push fluids, especially water Take Macrobid twice daily for 7 days Follow-up as needed  Urinary Tract Infection, Adult A urinary tract infection (UTI) is an infection of any part of the urinary tract. The urinary tract includes: The kidneys. The ureters. The bladder. The urethra. These organs make, store, and get rid of pee (urine) in the body. What are the causes? This infection is caused by germs (bacteria) in your genital area. These germs grow and cause swelling (inflammation) of your urinary tract. What increases the risk? The following factors may make you more likely to develop this condition: Using a small, thin tube (catheter) to drain pee. Not being able to control when you pee or poop (incontinence). Being female. If you are female, these things can increase the risk: Using these methods to prevent pregnancy: A medicine that kills sperm (spermicide). A device that blocks sperm (diaphragm). Having low levels of a female hormone (estrogen). Being pregnant. You are more likely to develop this condition if: You have genes that add to your risk. You are sexually active. You take antibiotic medicines. You have trouble peeing because of: A prostate that is bigger than normal, if you are female. A blockage in the part of your body that drains pee from the bladder. A kidney stone. A nerve condition that affects your bladder. Not getting enough to drink. Not peeing often enough. You have other conditions, such as: Diabetes. A weak disease-fighting system (immune system). Sickle cell disease. Gout. Injury of the spine. What are the signs or symptoms? Symptoms of this condition include: Needing to pee right away. Peeing small amounts often. Pain or burning when peeing. Blood in the pee. Pee that smells bad or not like normal. Trouble peeing. Pee that is cloudy. Fluid coming from the vagina, if you  are female. Pain in the belly or lower back. Other symptoms include: Vomiting. Not feeling hungry. Feeling mixed up (confused). This may be the first symptom in older adults. Being tired and grouchy (irritable). A fever. Watery poop (diarrhea). How is this treated? Taking antibiotic medicine. Taking other medicines. Drinking enough water. In some cases, you may need to see a specialist. Follow these instructions at home:  Medicines Take over-the-counter and prescription medicines only as told by your doctor. If you were prescribed an antibiotic medicine, take it as told by your doctor. Do not stop taking it even if you start to feel better. General instructions Make sure you: Pee until your bladder is empty. Do not hold pee for a long time. Empty your bladder after sex. Wipe from front to back after peeing or pooping if you are a female. Use each tissue one time when you wipe. Drink enough fluid to keep your pee pale yellow. Keep all follow-up visits. Contact a doctor if: You do not get better after 1-2 days. Your symptoms go away and then come back. Get help right away if: You have very bad back pain. You have very bad pain in your lower belly. You have a fever. You have chills. You feeling like you will vomit or you vomit. Summary A urinary tract infection (UTI) is an infection of any part of the urinary tract. This condition is caused by germs in your genital area. There are many risk factors for a UTI. Treatment includes antibiotic medicines. Drink enough fluid to keep your pee pale yellow. This information is not intended to replace advice given to you  by your health care provider. Make sure you discuss any questions you have with your healthcare provider. Document Revised: 07/19/2020 Document Reviewed: 07/19/2020 Elsevier Patient Education  2022 Elsevier Inc.   High-Fiber Eating Plan Fiber, also called dietary fiber, is a type of carbohydrate. It is found foods  such as fruits, vegetables, whole grains, and beans. A high-fiber diet can have many health benefits. Your health care provider may recommend a high-fiber diet to help: Prevent constipation. Fiber can make your bowel movements more regular. Lower your cholesterol. Relieve the following conditions: Inflammation of veins in the anus (hemorrhoids). Inflammation of specific areas of the digestive tract (uncomplicated diverticulosis). A problem of the large intestine, also called the colon, that sometimes causes pain and diarrhea (irritable bowel syndrome, or IBS). Prevent overeating as part of a weight-loss plan. Prevent heart disease, type 2 diabetes, and certain cancers. What are tips for following this plan? Reading food labels  Check the nutrition facts label on food products for the amount of dietary fiber. Choose foods that have 5 grams of fiber or more per serving. The goals for recommended daily fiber intake include: Men (age 23 or younger): 34-38 g. Men (over age 79): 28-34 g. Women (age 42 or younger): 25-28 g. Women (over age 13): 22-25 g. Your daily fiber goal is _____________ g. Shopping Choose whole fruits and vegetables instead of processed forms, such as apple juice or applesauce. Choose a wide variety of high-fiber foods such as avocados, lentils, oats, and kidney beans. Read the nutrition facts label of the foods you choose. Be aware of foods with added fiber. These foods often have high sugar and sodium amounts per serving. Cooking Use whole-grain flour for baking and cooking. Cook with brown rice instead of white rice. Meal planning Start the day with a breakfast that is high in fiber, such as a cereal that contains 5 g of fiber or more per serving. Eat breads and cereals that are made with whole-grain flour instead of refined flour or white flour. Eat brown rice, bulgur wheat, or millet instead of white rice. Use beans in place of meat in soups, salads, and pasta  dishes. Be sure that half of the grains you eat each day are whole grains. General information You can get the recommended daily intake of dietary fiber by: Eating a variety of fruits, vegetables, grains, nuts, and beans. Taking a fiber supplement if you are not able to take in enough fiber in your diet. It is better to get fiber through food than from a supplement. Gradually increase how much fiber you consume. If you increase your intake of dietary fiber too quickly, you may have bloating, cramping, or gas. Drink plenty of water to help you digest fiber. Choose high-fiber snacks, such as berries, raw vegetables, nuts, and popcorn. What foods should I eat? Fruits Berries. Pears. Apples. Oranges. Avocado. Prunes and raisins. Dried figs. Vegetables Sweet potatoes. Spinach. Kale. Artichokes. Cabbage. Broccoli. Cauliflower.Green peas. Carrots. Squash. Grains Whole-grain breads. Multigrain cereal. Oats and oatmeal. Brown rice. Barley.Bulgur wheat. Millet. Quinoa. Bran muffins. Popcorn. Rye wafer crackers. Meats and other proteins Navy beans, kidney beans, and pinto beans. Soybeans. Split peas. Lentils. Nutsand seeds. Dairy Fiber-fortified yogurt. Beverages Fiber-fortified soy milk. Fiber-fortified orange juice. Other foods Fiber bars. The items listed above may not be a complete list of recommended foods and beverages. Contact a dietitian for more information. What foods should I avoid? Fruits Fruit juice. Cooked, strained fruit. Vegetables Fried potatoes. Canned vegetables. Well-cooked vegetables. Grains White  bread. Pasta made with refined flour. White rice. Meats and other proteins Fatty cuts of meat. Fried chicken or fried fish. Dairy Milk. Yogurt. Cream cheese. Sour cream. Fats and oils Butters. Beverages Soft drinks. Other foods Cakes and pastries. The items listed above may not be a complete list of foods and beverages to avoid. Talk with your dietitian about what choices  are best for you. Summary Fiber is a type of carbohydrate. It is found in foods such as fruits, vegetables, whole grains, and beans. A high-fiber diet has many benefits. It can help to prevent constipation, lower blood cholesterol, aid weight loss, and reduce your risk of heart disease, diabetes, and certain cancers. Increase your intake of fiber gradually. Increasing fiber too quickly may cause cramping, bloating, and gas. Drink plenty of water while you increase the amount of fiber you consume. The best sources of fiber include whole fruits and vegetables, whole grains, nuts, seeds, and beans. This information is not intended to replace advice given to you by your health care provider. Make sure you discuss any questions you have with your healthcare provider. Document Revised: 04/11/2020 Document Reviewed: 04/11/2020 Elsevier Patient Education  2022 Elsevier Inc. Constipation, Adult Constipation is when a person has trouble pooping (having a bowel movement). When you have this condition, you may poop fewer than 3 times a week. Your poop (stool) may also be dry, hard, or bigger than normal. Follow these instructions at home: Eating and drinking  Eat foods that have a lot of fiber, such as: Fresh fruits and vegetables. Whole grains. Beans. Eat less of foods that are low in fiber and high in fat and sugar, such as: [place] fries. Hamburgers. Cookies. Candy. Soda. Drink enough fluid to keep your pee (urine) pale yellow.  General instructions Exercise regularly or as told by your doctor. Try to do 150 minutes of exercise each week. Go to the restroom when you feel like you need to poop. Do not hold it in. Take over-the-counter and prescription medicines only as told by your doctor. These include any fiber supplements. When you poop: Do deep breathing while relaxing your lower belly (abdomen). Relax your pelvic floor. The pelvic floor is a group of muscles that support the rectum,  bladder, and intestines (as well as the uterus in women). Watch your condition for any changes. Tell your doctor if you notice any. Keep all follow-up visits as told by your doctor. This is important. Contact a doctor if: You have pain that gets worse. You have a fever. You have not pooped for 4 days. You vomit. You are not hungry. You lose weight. You are bleeding from the opening of the butt (anus). You have thin, pencil-like poop. Get help right away if: You have a fever, and your symptoms suddenly get worse. You leak poop or have blood in your poop. Your belly feels hard or bigger than normal (bloated). You have very bad belly pain. You feel dizzy or you faint. Summary Constipation is when a person poops fewer than 3 times a week, has trouble pooping, or has poop that is dry, hard, or bigger than normal. Eat foods that have a lot of fiber. Drink enough fluid to keep your pee (urine) pale yellow. Take over-the-counter and prescription medicines only as told by your doctor. These include any fiber supplements. This information is not intended to replace advice given to you by your health care provider. Make sure you discuss any questions you have with your healthcare provider. Document Revised:  10/25/2019 Document Reviewed: 10/25/2019 Elsevier Patient Education  2022 [employer].

## 2021-07-03 NOTE — Addendum Note (Signed)
Addended by: Precious Reel on: 07/03/2021 09:13 AM   Modules accepted: Orders

## 2021-07-04 LAB — URINE CULTURE

## 2021-07-07 ENCOUNTER — Other Ambulatory Visit: Payer: Self-pay | Admitting: Nurse Practitioner

## 2021-07-07 DIAGNOSIS — Z1211 Encounter for screening for malignant neoplasm of colon: Secondary | ICD-10-CM

## 2021-07-14 ENCOUNTER — Encounter: Payer: Self-pay | Admitting: Gastroenterology

## 2021-07-31 ENCOUNTER — Other Ambulatory Visit: Payer: Self-pay | Admitting: Legal Medicine

## 2021-08-18 ENCOUNTER — Ambulatory Visit: Payer: BC Managed Care – PPO | Admitting: Gastroenterology

## 2021-09-09 ENCOUNTER — Telehealth: Payer: Self-pay | Admitting: General Surgery

## 2021-09-09 ENCOUNTER — Ambulatory Visit (INDEPENDENT_AMBULATORY_CARE_PROVIDER_SITE_OTHER): Payer: BC Managed Care – PPO | Admitting: Gastroenterology

## 2021-09-09 ENCOUNTER — Other Ambulatory Visit: Payer: Self-pay

## 2021-09-09 ENCOUNTER — Encounter: Payer: Self-pay | Admitting: Gastroenterology

## 2021-09-09 VITALS — BP 136/84 | HR 65 | Ht 65.0 in | Wt 343.0 lb

## 2021-09-09 DIAGNOSIS — K219 Gastro-esophageal reflux disease without esophagitis: Secondary | ICD-10-CM | POA: Diagnosis not present

## 2021-09-09 DIAGNOSIS — E66813 Obesity, class 3: Secondary | ICD-10-CM

## 2021-09-09 DIAGNOSIS — R1031 Right lower quadrant pain: Secondary | ICD-10-CM | POA: Diagnosis not present

## 2021-09-09 DIAGNOSIS — Z6841 Body Mass Index (BMI) 40.0 and over, adult: Secondary | ICD-10-CM

## 2021-09-09 DIAGNOSIS — Z1211 Encounter for screening for malignant neoplasm of colon: Secondary | ICD-10-CM

## 2021-09-09 DIAGNOSIS — R195 Other fecal abnormalities: Secondary | ICD-10-CM | POA: Diagnosis not present

## 2021-09-09 DIAGNOSIS — Z903 Acquired absence of stomach [part of]: Secondary | ICD-10-CM

## 2021-09-09 NOTE — Progress Notes (Signed)
Chief Complaint: Abdominal pain, GERD, change in bowel habits   Referring Provider:     Abigail Miyamoto, MD    HPI:     Erin Henderson is a 45 y.o. female with a history of HTN, HLD, obesity (BMI 57), hypothyroidism, anxiety, depression, cholecystectomy, gastric sleeve, hysterectomy, referred to the [facility] for evaluation of multiple GI symptoms, to include reflux symptoms, abdominal pain, and change in bowel habits.  She reports having heartburn which worsened after gastric sleeve on 01/12/2017.  Started taking Prilosec 40 mg/day with improvement of symptoms. No dysphagia. Breakthrough w/ any missed doses.  Separately, has had intermittent RLQ pain over the last 3+ months.   Described as episodic sharp, non-radiating pain, lasting a few seconds.  Can have multiple episodes per day.  Episodes now more frequent and more bothersome. Not a/w PO intake, activity, time of day, etc.   Hx of diarrhea since ccy, but more recently with alternating constipation and diarrhea. Started Miralax with some improvement. Has had associated abdominal bloating, increased flatus.  No hematochezia or melena.  No previous colonoscopy.  Upper endoscopy done preoperatively- unsure of results.   Recent intentional 27# weight loss with low carb dieting.   - 02/2021: MCV 78 (stable), otherwise normal CBC.  ALP 133, otherwise normal CMP - 06/21/2021: CT abdomen/pelvis: Probable hepatic steatosis, ccy, postsurgical changes in stomach, otherwise unremarkable GI tract   Past Medical History:  Diagnosis Date   Anxiety    Depression    GERD with esophagitis    Hyperlipidemia    Hypertension    Hypothyroidism      Past Surgical History:  Procedure Laterality Date   CHOLECYSTECTOMY     LAPAROSCOPIC GASTRIC SLEEVE RESECTION     VAGINAL HYSTERECTOMY     Family History  Problem Relation Age of Onset   Hypertension Mother    Hyperlipidemia Mother    Thyroid disease Mother     Migraines Mother    Asthma Father    Stroke Father    Diabetes Father    Hyperlipidemia Father    Hypertension Father    Thyroid disease Father    Liver cancer Father    Hyperlipidemia Sister    Hypertension Sister    Colon cancer Neg Hx    Rectal cancer Neg Hx    Social History   Tobacco Use   Smoking status: Former    Types: Cigarettes    Quit date: 2000    Years since quitting: 22.7   Smokeless tobacco: Never  Vaping Use   Vaping Use: Never used  Substance Use Topics   Alcohol use: Never   Drug use: Never   Current Outpatient Medications  Medication Sig Dispense Refill   ARIPiprazole (ABILIFY) 10 MG tablet Take 10 mg by mouth daily.     fluticasone (FLONASE) 50 MCG/ACT nasal spray Place 2 sprays into both nostrils daily. 16 g 6   ketorolac (TORADOL) 10 MG tablet TAKE ONE TABLET BY MOUTH ONCE DAILY AS NEEDED FOR MIGRAINES 30 tablet 3   L-Lysine 500 MG TABS Take by mouth. Once daily     levothyroxine (SYNTHROID) 100 MCG tablet TAKE 1 TABLET BY MOUTH ONCE DAILY. 30 tablet 6   losartan (COZAAR) 100 MG tablet TAKE 1 TABLET BY MOUTH ONCE DAILY. 90 tablet 2   omeprazole (PRILOSEC) 40 MG capsule TAKE 1 CAPSULE BY MOUTH ONCE DAILY. 30 capsule 6   Oxcarbazepine (TRILEPTAL) 300 MG  tablet Take 300 mg by mouth 2 (two) times daily.     pravastatin (PRAVACHOL) 20 MG tablet TAKE 1 TABLET BY MOUTH ONCE DAILY. 90 tablet 2   propranolol (INDERAL) 80 MG tablet TAKE 1 TABLET BY MOUTH ONCE DAILY. 30 tablet 6   rizatriptan (MAXALT) 10 MG tablet TAKE ONE TABLET BY MOUTH AT ONSET OF HEADACHE. MAY REPEAT IN TWO HOURS IF NEEDED. 30 tablet 6   sertraline (ZOLOFT) 100 MG tablet Take 100 mg by mouth daily.     No current facility-administered medications for this visit.   Allergies  Allergen Reactions   Cefdinir Hives and Nausea And Vomiting   Risperidone Other (See Comments)    unknown     Review of Systems: All systems reviewed and negative except where noted in HPI.     Physical  Exam:    Wt Readings from Last 3 Encounters:  09/09/21 (!) 343 lb (155.6 kg)  07/03/21 (!) 375 lb (170.1 kg)  05/23/21 (!) 353 lb (160.1 kg)    BP 136/84   Pulse 65   Ht 5\' 5"  (1.651 m)   Wt (!) 343 lb (155.6 kg)   SpO2 98%   BMI 57.08 kg/m  Constitutional:  Pleasant, in no acute distress. Psychiatric: Normal mood and affect. Behavior is normal. Cardiovascular: Normal rate, regular rhythm. No edema Pulmonary/chest: Effort normal and breath sounds normal. No wheezing, rales or rhonchi. Abdominal: TTP in RLQ with + Carnett's sign in broad distribution in RLQ.  Otherwise soft, nondistended. Bowel sounds active throughout. There are no masses palpable. No hepatomegaly. Neurological: Alert and oriented to person place and time. Skin: Skin is warm and dry. No rashes noted.   ASSESSMENT AND PLAN;   1) RLQ pain - Episodic RLQ pain lasting a few seconds over the last 3+ months.  Positive Carnett's sign, but in broad distribution.  Do not think this would be amenable to lidocaine/Kenalog injection.  Symptoms more likely to be MSK in etiology.  Recommended conservative management, heating pad, etc., and if needed could consider referral to sports med or pain med - Will evaluate for mucosal/luminal pathology at time of colonoscopy as below - Recent CT otherwise unrevealing  2) Colon cancer screening - Due for age-appropriate, average risk CRC screening - Schedule colonoscopy  3) Change in bowel habits - Evaluate for mucosal/luminal pathology at time of colonoscopy as above with random directed biopsies as appropriate - Continue laxative as needed for recent onset constipation  4) GERD 5) History of gastric sleeve - EGD to evaluate for erosive esophagitis, LES laxity, hiatal hernia - Evaluate postoperative anatomy with EGD - Continue Prilosec - Continue antireflux lifestyle/dietary modifications - Continue dieting  6) Obesity (BMI 57) - EGD/colonoscopy to be scheduled [facility]  due to elevated periprocedural risks - Applauded her recent weight loss through diet  The indications, risks, and benefits of EGD and colonoscopy were explained to the patient in detail. Risks include but are not limited to bleeding, perforation, adverse reaction to medications, and cardiopulmonary compromise. Sequelae include but are not limited to the possibility of surgery, hospitalization, and mortality. The patient verbalized understanding and wished to proceed. All questions answered, referred to scheduler and bowel prep ordered. Further recommendations pending results of the exam.    [facility], DO, FACG  09/09/2021, 11:22 AM   09/11/2021,*

## 2021-09-09 NOTE — Telephone Encounter (Signed)
Left a voicemail for the patient to contact the office. Scheduled the patient for ECL at [facility] on 12/03/2021 @830am . Patient left with blank instructions and I explained to her that I would leave her a message with the date and time and she will fill in the blanks as we go through her instructions

## 2021-09-09 NOTE — Patient Instructions (Signed)
If you are age 47 or older, your body mass index should be between 23-30. Your Body mass index is 57.08 kg/m. If this is out of the aforementioned range listed, please consider follow up with your Primary Care Provider.  If you are age 31 or younger, your body mass index should be between 19-25. Your Body mass index is 57.08 kg/m. If this is out of the aformentioned range listed, please consider follow up with your Primary Care Provider.   __________________________________________________________  The [facility] providers would like to encourage you to use [facility] to communicate with providers for non-urgent requests or questions.  Due to long hold times on the telephone, sending your provider a message by [facility] may be a faster and more efficient way to get a response.  Please allow 48 business hours for a response.  Please remember that this is for non-urgent requests.   We will need to perform your endoscopy and colonoscopy at [facility]. I have given you a prep for the colonoscopy called Clenpiq. We will contact you with a time and date for the procedure I have completed all necessary paperwork for you to follow for the procedure, please fill in the dates left blank.  Thank you for choosing me and  Gastroenterology.  Vito Cirigliano, D.O. .

## 2021-09-10 NOTE — Telephone Encounter (Signed)
Spoke with the patient and gave her the date of her procedure of 12/03/2021. The patient was given instructions and verbalized understanding

## 2021-09-11 ENCOUNTER — Other Ambulatory Visit: Payer: Self-pay

## 2021-09-11 ENCOUNTER — Ambulatory Visit (INDEPENDENT_AMBULATORY_CARE_PROVIDER_SITE_OTHER): Payer: BC Managed Care – PPO | Admitting: Legal Medicine

## 2021-09-11 ENCOUNTER — Encounter: Payer: Self-pay | Admitting: Legal Medicine

## 2021-09-11 VITALS — BP 102/64 | HR 69 | Temp 97.6°F | Resp 16 | Ht 65.0 in | Wt 341.0 lb

## 2021-09-11 DIAGNOSIS — J4520 Mild intermittent asthma, uncomplicated: Secondary | ICD-10-CM

## 2021-09-11 DIAGNOSIS — I10 Essential (primary) hypertension: Secondary | ICD-10-CM

## 2021-09-11 DIAGNOSIS — Z9884 Bariatric surgery status: Secondary | ICD-10-CM

## 2021-09-11 DIAGNOSIS — F321 Major depressive disorder, single episode, moderate: Secondary | ICD-10-CM

## 2021-09-11 DIAGNOSIS — K5909 Other constipation: Secondary | ICD-10-CM

## 2021-09-11 DIAGNOSIS — K2100 Gastro-esophageal reflux disease with esophagitis, without bleeding: Secondary | ICD-10-CM

## 2021-09-11 DIAGNOSIS — E782 Mixed hyperlipidemia: Secondary | ICD-10-CM

## 2021-09-11 DIAGNOSIS — E038 Other specified hypothyroidism: Secondary | ICD-10-CM | POA: Diagnosis not present

## 2021-09-11 DIAGNOSIS — Z6841 Body Mass Index (BMI) 40.0 and over, adult: Secondary | ICD-10-CM

## 2021-09-11 NOTE — Progress Notes (Signed)
Established Patient Office Visit  Subjective:  Patient ID: Erin Henderson, female    DOB: January 11, 1976  Age: 45 y.o. MRN: [id number]  CC:  Chief Complaint  Patient presents with   Hypertension   Hyperlipidemia   Gastroesophageal Reflux    HPI Erin Henderson presents for chronic visit  Patient presents for follow up of hypertension.  Patient tolerating losartan well with side effects.  Patient was diagnosed with hypertension 2010 so has been treated for hypertension for 10 years.Patient is working on maintaining diet and exercise regimen and follows up as directed. Complication include none.   Patient presents with hyperlipidemia.  Compliance with treatment has been good; patient takes medicines as directed, maintains low cholesterol diet, follows up as directed, and maintains exercise regimen.  Patient is using pravastatin without problems.   Patient has gastroesophageal reflux symptoms withesophagitis and LTRD.  The symptoms are moderate intensity.  Length of symptoms 10 years.  Medicines include omeprazole 58m.  Complications include none  S/p bariatric sleeve-decrease 34 lbs in 3 months   Past Medical History:  Diagnosis Date   Anxiety    Depression    GERD with esophagitis    Hyperlipidemia    Hypertension    Hypothyroidism     Past Surgical History:  Procedure Laterality Date   CHOLECYSTECTOMY     LAPAROSCOPIC GASTRIC SLEEVE RESECTION     VAGINAL HYSTERECTOMY      Family History  Problem Relation Age of Onset   Hypertension Mother    Hyperlipidemia Mother    Thyroid disease Mother    Migraines Mother    Asthma Father    Stroke Father    Diabetes Father    Hyperlipidemia Father    Hypertension Father    Thyroid disease Father    Liver cancer Father    Hyperlipidemia Sister    Hypertension Sister    Colon cancer Neg Hx    Rectal cancer Neg Hx     Social History   Socioeconomic History   Marital status: Married    Spouse name: Not on file   Number  of children: 2   Years of education: Not on file   Highest education level: Not on file  Occupational History   Occupation: school bus driver  Tobacco Use   Smoking status: Former    Packs/day: 1.00    Types: Cigarettes    Quit date: 2000    Years since quitting: 22.7   Smokeless tobacco: Never  Vaping Use   Vaping Use: Never used  Substance and Sexual Activity   Alcohol use: Never   Drug use: Never   Sexual activity: Not on file  Other Topics Concern   Not on file  Social History Narrative   Not on file   Social Determinants of Health   Financial Resource Strain: Not on file  Food Insecurity: Not on file  Transportation Needs: Not on file  Physical Activity: Not on file  Stress: Not on file  Social Connections: Not on file  Intimate Partner Violence: Not on file    Outpatient Medications Prior to Visit  Medication Sig Dispense Refill   ARIPiprazole (ABILIFY) 10 MG tablet Take 10 mg by mouth daily.     fluticasone (FLONASE) 50 MCG/ACT nasal spray Place 2 sprays into both nostrils daily. 16 g 6   ketorolac (TORADOL) 10 MG tablet TAKE ONE TABLET BY MOUTH ONCE DAILY AS NEEDED FOR MIGRAINES 30 tablet 3   L-Lysine 500 MG TABS Take by mouth. Once  daily     levothyroxine (SYNTHROID) 100 MCG tablet TAKE 1 TABLET BY MOUTH ONCE DAILY. 30 tablet 6   losartan (COZAAR) 100 MG tablet TAKE 1 TABLET BY MOUTH ONCE DAILY. 90 tablet 2   omeprazole (PRILOSEC) 40 MG capsule TAKE 1 CAPSULE BY MOUTH ONCE DAILY. 30 capsule 6   Oxcarbazepine (TRILEPTAL) 300 MG tablet Take 300 mg by mouth 2 (two) times daily.     pravastatin (PRAVACHOL) 20 MG tablet TAKE 1 TABLET BY MOUTH ONCE DAILY. 90 tablet 2   propranolol (INDERAL) 80 MG tablet TAKE 1 TABLET BY MOUTH ONCE DAILY. 30 tablet 6   rizatriptan (MAXALT) 10 MG tablet TAKE ONE TABLET BY MOUTH AT ONSET OF HEADACHE. MAY REPEAT IN TWO HOURS IF NEEDED. 30 tablet 6   sertraline (ZOLOFT) 100 MG tablet Take 100 mg by mouth daily.     No  facility-administered medications prior to visit.    Allergies  Allergen Reactions   Cefdinir Hives and Nausea And Vomiting   Risperidone Other (See Comments)    unknown    ROS Review of Systems  Constitutional:  Negative for activity change and appetite change.  HENT:  Negative for congestion.   Eyes:  Negative for visual disturbance.  Respiratory:  Negative for chest tightness and shortness of breath.   Cardiovascular:  Negative for chest pain and palpitations.  Gastrointestinal:  Negative for abdominal distention and abdominal pain.  Endocrine: Negative for polyuria.  Genitourinary:  Negative for difficulty urinating and dysuria.  Musculoskeletal:  Negative for arthralgias and back pain.  Skin: Negative.   Neurological: Negative.   Psychiatric/Behavioral: Negative.       Objective:    Physical Exam Vitals reviewed.  Constitutional:      General: She is not in acute distress.    Appearance: Normal appearance. She is obese.  HENT:     Head: Normocephalic.     Right Ear: Tympanic membrane normal.     Left Ear: Tympanic membrane normal.     Nose: Nose normal.     Mouth/Throat:     Mouth: Mucous membranes are moist.     Pharynx: Oropharynx is clear.  Eyes:     Extraocular Movements: Extraocular movements intact.     Conjunctiva/sclera: Conjunctivae normal.     Pupils: Pupils are equal, round, and reactive to light.  Cardiovascular:     Rate and Rhythm: Normal rate and regular rhythm.     Pulses: Normal pulses.     Heart sounds: Normal heart sounds. No murmur heard.   No gallop.  Pulmonary:     Effort: Pulmonary effort is normal. No respiratory distress.     Breath sounds: Normal breath sounds. No wheezing.  Abdominal:     General: Abdomen is flat. Bowel sounds are normal. There is no distension.     Palpations: Abdomen is soft.     Tenderness: There is no abdominal tenderness.  Musculoskeletal:        General: Normal range of motion.     Cervical back: Normal  range of motion and neck supple.     Right lower leg: No edema.     Left lower leg: No edema.  Skin:    General: Skin is warm.     Capillary Refill: Capillary refill takes less than 2 seconds.  Neurological:     General: No focal deficit present.     Mental Status: She is alert and oriented to person, place, and time. Mental status is at baseline.  Deep Tendon Reflexes: Reflexes normal.  Psychiatric:        Mood and Affect: Mood normal.        Thought Content: Thought content normal.    BP 102/64   Pulse 69   Temp 97.6 F (36.4 C)   Resp 16   Ht 5' 5"  (1.651 m)   Wt (!) 341 lb (154.7 kg)   SpO2 96%   BMI 56.75 kg/m  Wt Readings from Last 3 Encounters:  09/11/21 (!) 341 lb (154.7 kg)  09/09/21 (!) 343 lb (155.6 kg)  07/03/21 (!) 375 lb (170.1 kg)     Health Maintenance Due  Topic Date Due   HIV Screening  Never done   Hepatitis C Screening  Never done   PAP SMEAR-Modifier  Never done   COLONOSCOPY (Pts 45-33yr Insurance coverage will need to be confirmed)  Never done   MAMMOGRAM  02/13/2021   INFLUENZA VACCINE  07/21/2021    There are no preventive care reminders to display for this patient.  Lab Results  Component Value Date   TSH 2.790 03/11/2021   Lab Results  Component Value Date   WBC 9.4 03/11/2021   HGB 12.3 03/11/2021   HCT 38.7 03/11/2021   MCV 78 (L) 03/11/2021   PLT 241 03/11/2021   Lab Results  Component Value Date   NA 142 03/11/2021   K 3.9 03/11/2021   CO2 23 03/11/2021   GLUCOSE 97 03/11/2021   BUN 11 03/11/2021   CREATININE 0.97 03/11/2021   BILITOT 0.3 03/11/2021   ALKPHOS 133 (H) 03/11/2021   AST 17 03/11/2021   ALT 32 03/11/2021   PROT 6.4 03/11/2021   ALBUMIN 4.2 03/11/2021   CALCIUM 9.1 03/11/2021   EGFR 73 03/11/2021   Lab Results  Component Value Date   CHOL 170 03/11/2021   Lab Results  Component Value Date   HDL 33 (L) 03/11/2021   Lab Results  Component Value Date   LDLCALC 90 03/11/2021   Lab Results   Component Value Date   TRIG 278 (H) 03/11/2021   Lab Results  Component Value Date   CHOLHDL 5.2 (H) 03/11/2021   Lab Results  Component Value Date   HGBA1C 5.1 03/11/2021      Assessment & Plan:   Problem List Items Addressed This Visit       Cardiovascular and Mediastinum   Essential hypertension - Primary   Relevant Orders   Comprehensive metabolic panel   CBC with Differential/Platelet An individual hypertension care plan was established and reinforced today.  The patient's status was assessed using clinical findings on exam and labs or diagnostic tests. The patient's success at meeting treatment goals on disease specific evidence-based guidelines and found to be well controlled. SELF MANAGEMENT: The patient and I together assessed ways to personally work towards obtaining the recommended goals. RECOMMENDATIONS: avoid decongestants found in common cold remedies, decrease consumption of alcohol, perform routine monitoring of BP with home BP cuff, exercise, reduction of dietary salt, take medicines as prescribed, try not to miss doses and quit smoking.  Regular exercise and maintaining a healthy weight is needed.  Stress reduction may help. A CLINICAL SUMMARY including written plan identify barriers to care unique to individual due to social or financial issues.  We attempt to mutually creat solutions for individual and family understanding.      Respiratory   Asthma This patient has asthma mild and is on albuterol.  Patient is not having a flair.  Chronic  medicines include albuterol. Addition new medicines none.  Asthma action plan is in place.       Digestive   GERD with esophagitis Plan of care was formulated today.  She is doing well.  A plan of care was formulated using patient exam, tests and other sources to optimize care using evidence based information.  Recommend no smoking, no eating after supper, avoid fatty foods, elevate Head of bed, avoid tight fitting clothing.   Continue on omeprazole.      Endocrine   Hypothyroidism   Relevant Orders   TSH Patient is known to have hypothyroidism and is on treatment with levothyroxine 80mg.  Patient was diagnosed 10 years ago.  Other treatment includes none.  Patient is compliant with medicines and last TSH 6 months ago.  Last TSH was normal.      Other   Mixed hyperlipidemia   Relevant Orders   Lipid panel AN INDIVIDUAL CARE PLAN for hyperlipidemia/ cholesterol was established and reinforced today.  The patient's status was assessed using clinical findings on exam, lab and other diagnostic tests. The patient's disease status was assessed based on evidence-based guidelines and found to be fair controlled. MEDICATIONS were reviewed. SELF MANAGEMENT GOALS have been discussed and patient's success at attaining the goal of low cholesterol was assessed. RECOMMENDATION given include regular exercise 3 days a week and low cholesterol/low fat diet. CLINICAL SUMMARY including written plan to identify barriers unique to the patient due to social or economic  reasons was discussed.    Bariatric surgery status She continues to lose weight    [place] An individualize plan was formulated for obesity using patient history and physical exam to encourage weight loss.  An evidence based program was formulated.  Patient is to cut portion size with meals and to plan physical exercise 3 days a week at least 20 minutes.  Weight watchers and other programs are helpful.  Planned amount of weight loss 10 lbs.     BMI 50.0-59.9, adult ([facility] An individualize plan was formulated for obesity using patient history and physical exam to encourage weight loss.  An evidence based program was formulated.  Patient is to cut portion size with meals and to plan physical exercise 3 days a week at least 20 minutes.  Weight watchers and other programs are helpful.  Planned amount of weight loss 10 lbs.     Current moderate episode of major  depressive disorder without prior episode ([facility] Patient continues with depression but stable on medicines   Other Visit Diagnoses     onstipation          Follow-up: Return in about 6 months (around 03/11/2022) for fasting.    LReinaldo Meeker MD

## 2021-09-12 LAB — CBC WITH DIFFERENTIAL/PLATELET
Basophils Absolute: 0 10*3/uL (ref 0.0–0.2)
Basos: 0 %
EOS (ABSOLUTE): 0.1 10*3/uL (ref 0.0–0.4)
Eos: 1 %
Hematocrit: 37.5 % (ref 34.0–46.6)
Hemoglobin: 12 g/dL (ref 11.1–15.9)
Immature Grans (Abs): 0 10*3/uL (ref 0.0–0.1)
Immature Granulocytes: 0 %
Lymphocytes Absolute: 3 10*3/uL (ref 0.7–3.1)
Lymphs: 34 %
MCH: 25.8 pg — ABNORMAL LOW (ref 26.6–33.0)
MCHC: 32 g/dL (ref 31.5–35.7)
MCV: 81 fL (ref 79–97)
Monocytes Absolute: 0.5 10*3/uL (ref 0.1–0.9)
Monocytes: 6 %
Neutrophils Absolute: 5.3 10*3/uL (ref 1.4–7.0)
Neutrophils: 59 %
Platelets: 224 10*3/uL (ref 150–450)
RBC: 4.66 x10E6/uL (ref 3.77–5.28)
RDW: 14.6 % (ref 11.7–15.4)
WBC: 9 10*3/uL (ref 3.4–10.8)

## 2021-09-12 LAB — COMPREHENSIVE METABOLIC PANEL
ALT: 31 IU/L (ref 0–32)
AST: 19 IU/L (ref 0–40)
Albumin/Globulin Ratio: 1.8 (ref 1.2–2.2)
Albumin: 4.2 g/dL (ref 3.8–4.8)
Alkaline Phosphatase: 119 IU/L (ref 44–121)
BUN/Creatinine Ratio: 20 (ref 9–23)
BUN: 16 mg/dL (ref 6–24)
Bilirubin Total: 0.3 mg/dL (ref 0.0–1.2)
CO2: 23 mmol/L (ref 20–29)
Calcium: 9.2 mg/dL (ref 8.7–10.2)
Chloride: 105 mmol/L (ref 96–106)
Creatinine, Ser: 0.79 mg/dL (ref 0.57–1.00)
Globulin, Total: 2.3 g/dL (ref 1.5–4.5)
Glucose: 90 mg/dL (ref 65–99)
Potassium: 4.4 mmol/L (ref 3.5–5.2)
Sodium: 143 mmol/L (ref 134–144)
Total Protein: 6.5 g/dL (ref 6.0–8.5)
eGFR: 93 mL/min/{1.73_m2} (ref >59)

## 2021-09-12 LAB — TSH: TSH: 0.966 u[IU]/mL (ref 0.450–4.500)

## 2021-09-12 LAB — LIPID PANEL
Chol/HDL Ratio: 5.4 ratio — ABNORMAL HIGH (ref 0.0–4.4)
Cholesterol, Total: 162 mg/dL (ref 100–199)
HDL: 30 mg/dL — ABNORMAL LOW (ref >39)
LDL Chol Calc (NIH): 97 mg/dL (ref 0–99)
Triglycerides: 202 mg/dL — ABNORMAL HIGH (ref 0–149)
VLDL Cholesterol Cal: 35 mg/dL (ref 5–40)

## 2021-09-12 LAB — CARDIOVASCULAR RISK ASSESSMENT

## 2021-09-12 NOTE — Progress Notes (Signed)
Kidney and liver tests normal, triglycerides 202 high, TSH 0.966 normal, cbc normal,  lp

## 2021-09-22 ENCOUNTER — Ambulatory Visit (INDEPENDENT_AMBULATORY_CARE_PROVIDER_SITE_OTHER): Payer: BC Managed Care – PPO | Admitting: Nurse Practitioner

## 2021-09-22 ENCOUNTER — Encounter: Payer: Self-pay | Admitting: Nurse Practitioner

## 2021-09-22 VITALS — BP 122/78 | HR 65 | Temp 97.3°F | Ht 65.0 in | Wt 336.0 lb

## 2021-09-22 DIAGNOSIS — R051 Acute cough: Secondary | ICD-10-CM

## 2021-09-22 DIAGNOSIS — J018 Other acute sinusitis: Secondary | ICD-10-CM

## 2021-09-22 DIAGNOSIS — H65112 Acute and subacute allergic otitis media (mucoid) (sanguinous) (serous), left ear: Secondary | ICD-10-CM | POA: Diagnosis not present

## 2021-09-22 DIAGNOSIS — J301 Allergic rhinitis due to pollen: Secondary | ICD-10-CM | POA: Diagnosis not present

## 2021-09-22 DIAGNOSIS — Z1231 Encounter for screening mammogram for malignant neoplasm of breast: Secondary | ICD-10-CM

## 2021-09-22 LAB — POC COVID19 BINAXNOW: SARS Coronavirus 2 Ag: NEGATIVE

## 2021-09-22 LAB — POCT INFLUENZA A/B: Influenza A, POC: NEGATIVE

## 2021-09-22 MED ORDER — PROMETHAZINE-DM 6.25-15 MG/5ML PO SYRP
5.0000 mL | ORAL_SOLUTION | Freq: Four times a day (QID) | ORAL | 0 refills | Status: DC | PRN
Start: 2021-09-22 — End: 2021-11-28

## 2021-09-22 MED ORDER — FLUTICASONE PROPIONATE 50 MCG/ACT NA SUSP: 2.0000 | Freq: Every day | NASAL | 6 refills | Status: AC

## 2021-09-22 MED ORDER — AMOXICILLIN-POT CLAVULANATE 875-125 MG PO TABS: 1.0000 | ORAL_TABLET | Freq: Two times a day (BID) | ORAL | 0 refills | Status: DC

## 2021-09-22 NOTE — Progress Notes (Signed)
Acute Office Visit  Subjective:    Patient ID: Erin Henderson, female    DOB: 11/11/1976, 45 y.o.   MRN: [id number]  Chief Complaint  Patient presents with   Sinusitis     HPI Patient is in today for sinus congestion/pain, headache, post-nasal-drip, and bilateral ear fullness. symptoms which initially started 2-3 weeks ago. Treatment has included Claritin-D, Ibuprofen, and Flonase nasal spray. She has a non-productive cough that worsens when she lies down.    Past Medical History:  Diagnosis Date   Anxiety    Depression    GERD with esophagitis    Hyperlipidemia    Hypertension    Hypothyroidism     Past Surgical History:  Procedure Laterality Date   CHOLECYSTECTOMY     LAPAROSCOPIC GASTRIC SLEEVE RESECTION     VAGINAL HYSTERECTOMY      Family History  Problem Relation Age of Onset   Hypertension Mother    Hyperlipidemia Mother    Thyroid disease Mother    Migraines Mother    Asthma Father    Stroke Father    Diabetes Father    Hyperlipidemia Father    Hypertension Father    Thyroid disease Father    Liver cancer Father    Hyperlipidemia Sister    Hypertension Sister    Colon cancer Neg Hx    Rectal cancer Neg Hx     Social History   Socioeconomic History   Marital status: Married    Spouse name: Not on file   Number of children: 2   Years of education: Not on file   Highest education level: Not on file  Occupational History   Occupation: school bus driver  Tobacco Use   Smoking status: Former    Packs/day: 1.00    Types: Cigarettes    Quit date: 2000    Years since quitting: 22.7   Smokeless tobacco: Never  Vaping Use   Vaping Use: Never used  Substance and Sexual Activity   Alcohol use: Never   Drug use: Never   Sexual activity: Not on file  Other Topics Concern   Not on file  Social History Narrative   Not on file   Social Determinants of Health   Financial Resource Strain: Not on file  Food Insecurity: Not on file   Transportation Needs: Not on file  Physical Activity: Not on file  Stress: Not on file  Social Connections: Not on file  Intimate Partner Violence: Not on file    Outpatient Medications Prior to Visit  Medication Sig Dispense Refill   ARIPiprazole (ABILIFY) 10 MG tablet Take 10 mg by mouth daily.     fluticasone (FLONASE) 50 MCG/ACT nasal spray Place 2 sprays into both nostrils daily. 16 g 6   ketorolac (TORADOL) 10 MG tablet TAKE ONE TABLET BY MOUTH ONCE DAILY AS NEEDED FOR MIGRAINES 30 tablet 3   L-Lysine 500 MG TABS Take by mouth. Once daily     levothyroxine (SYNTHROID) 100 MCG tablet TAKE 1 TABLET BY MOUTH ONCE DAILY. 30 tablet 6   losartan (COZAAR) 100 MG tablet TAKE 1 TABLET BY MOUTH ONCE DAILY. 90 tablet 2   omeprazole (PRILOSEC) 40 MG capsule TAKE 1 CAPSULE BY MOUTH ONCE DAILY. 30 capsule 6   Oxcarbazepine (TRILEPTAL) 300 MG tablet Take 300 mg by mouth 2 (two) times daily.     pravastatin (PRAVACHOL) 20 MG tablet TAKE 1 TABLET BY MOUTH ONCE DAILY. 90 tablet 2   propranolol (INDERAL) 80 MG tablet TAKE  1 TABLET BY MOUTH ONCE DAILY. 30 tablet 6   rizatriptan (MAXALT) 10 MG tablet TAKE ONE TABLET BY MOUTH AT ONSET OF HEADACHE. MAY REPEAT IN TWO HOURS IF NEEDED. 30 tablet 6   sertraline (ZOLOFT) 100 MG tablet Take 100 mg by mouth daily.     No facility-administered medications prior to visit.    Allergies  Allergen Reactions   Cefdinir Hives and Nausea And Vomiting   Risperidone Other (See Comments)    unknown    Review of Systems  Constitutional:  Positive for fatigue. Negative for chills and fever.  HENT:  Positive for congestion, ear pain (Left), postnasal drip, rhinorrhea, sinus pressure and sinus pain. Negative for sore throat.   Eyes:  Positive for pain (pressure behind bilateral eyes).  Respiratory:  Positive for cough and shortness of breath. Negative for wheezing.   Cardiovascular:  Negative for chest pain.  Gastrointestinal:  Negative for abdominal pain,  constipation, diarrhea, nausea and vomiting.  Endocrine: Negative.   Genitourinary: Negative.   Musculoskeletal: Negative.   Skin: Negative.   Allergic/Immunologic: Positive for environmental allergies.  Neurological:  Positive for headaches. Negative for dizziness and weakness.  Hematological: Negative.   Psychiatric/Behavioral: Negative.        Objective:    Physical Exam Vitals reviewed.  HENT:     Right Ear: Tympanic membrane normal.     Left Ear: Tenderness present. A middle ear effusion is present. Tympanic membrane is erythematous.     Nose: Congestion and rhinorrhea present.     Mouth/Throat:     Pharynx: Posterior oropharyngeal erythema present.  Cardiovascular:     Rate and Rhythm: Normal rate and regular rhythm.     Pulses: Normal pulses.     Heart sounds: Normal heart sounds.  Pulmonary:     Effort: Pulmonary effort is normal.     Breath sounds: Normal breath sounds.  Abdominal:     General: Bowel sounds are normal.     Palpations: Abdomen is soft.  Musculoskeletal:     Cervical back: No tenderness.  Lymphadenopathy:     Cervical: No cervical adenopathy.  Skin:    General: Skin is warm and dry.     Capillary Refill: Capillary refill takes less than 2 seconds.     Findings: No rash.  Neurological:     General: No focal deficit present.     Mental Status: She is alert and oriented to person, place, and time.    Pulse 65   Temp (!) 97.3 F (36.3 C)   Ht 5' 5"  (1.651 m)   Wt (!) 336 lb (152.4 kg)   SpO2 96%   BMI 55.91 kg/m  Wt Readings from Last 3 Encounters:  09/22/21 (!) 336 lb (152.4 kg)  09/11/21 (!) 341 lb (154.7 kg)  09/09/21 (!) 343 lb (155.6 kg)    Health Maintenance Due  Topic Date Due   HIV Screening  Never done   Hepatitis C Screening  Never done   PAP SMEAR-Modifier  Never done   COLONOSCOPY (Pts 45-25yr Insurance coverage will need to be confirmed)  Never done   MAMMOGRAM  02/13/2021   INFLUENZA VACCINE  07/21/2021    There  are no preventive care reminders to display for this patient.   Lab Results  Component Value Date   TSH 0.966 09/11/2021   Lab Results  Component Value Date   WBC 9.0 09/11/2021   HGB 12.0 09/11/2021   HCT 37.5 09/11/2021   MCV 81 09/11/2021  PLT 224 09/11/2021   Lab Results  Component Value Date   NA 143 09/11/2021   K 4.4 09/11/2021   CO2 23 09/11/2021   GLUCOSE 90 09/11/2021   BUN 16 09/11/2021   CREATININE 0.79 09/11/2021   BILITOT 0.3 09/11/2021   ALKPHOS 119 09/11/2021   AST 19 09/11/2021   ALT 31 09/11/2021   PROT 6.5 09/11/2021   ALBUMIN 4.2 09/11/2021   CALCIUM 9.2 09/11/2021   EGFR 93 09/11/2021   Lab Results  Component Value Date   CHOL 162 09/11/2021   Lab Results  Component Value Date   HDL 30 (L) 09/11/2021   Lab Results  Component Value Date   LDLCALC 97 09/11/2021   Lab Results  Component Value Date   TRIG 202 (H) 09/11/2021   Lab Results  Component Value Date   CHOLHDL 5.4 (H) 09/11/2021   Lab Results  Component Value Date   HGBA1C 5.1 03/11/2021       Assessment & Plan:   1. Acute non-recurrent sinusitis of other sinus - amoxicillin-clavulanate (AUGMENTIN) 875-125 MG tablet; Take 1 tablet by mouth 2 (two) times daily.  Dispense: 20 tablet; Refill: 0  2. Non-recurrent acute allergic otitis media of left ear - amoxicillin-clavulanate (AUGMENTIN) 875-125 MG tablet; Take 1 tablet by mouth 2 (two) times daily.  Dispense: 20 tablet; Refill: 0  3. Seasonal allergic rhinitis due to pollen - fluticasone (FLONASE) 50 MCG/ACT nasal spray; Place 2 sprays into both nostrils daily.  Dispense: 16 g; Refill: 6  4. Acute cough - promethazine-dextromethorphan (PROMETHAZINE-DM) 6.25-15 MG/5ML syrup; Take 5 mLs by mouth 4 (four) times daily as needed.  Dispense: 118 mL; Refill: 0 - POCT Influenza A/B - POC COVID-19 BinaxNow  5. Encounter for screening mammogram for malignant neoplasm of breast - [facility] DIGITAL SCREENING BILATERAL     Flonase  nasal spray daily Take Augmentin twice daily for 10 days Take cough up to four times a day as needed We will call you with mammogram appt for Oct 24th, 2022 Follow-up as needed      Follow-up: Return if symptoms worsen or fail to improve.  An After Visit Summary was printed and given to the patient.   I,Katherina A Bramblett,acting as a scribe for [employer], NP.,have documented all relevant documentation on the behalf of Rip Harbour, NP,as directed by  Rip Harbour, NP while in the presence of Rip Harbour, NP.  I, Rip Harbour, NP, have reviewed all documentation for this visit. The documentation on 09/22/21 for the exam, diagnosis, procedures, and orders are all accurate and complete.   Rip Harbour, NP [place] [phone]

## 2021-09-22 NOTE — Patient Instructions (Addendum)
Flonase nasal spray daily Take Augmentin twice daily for 10 days Take cough up to four times a day as needed We will call you with mammogram appt for Oct 24th, 2022 Follow-up as needed   Sinusitis, Adult Sinusitis is soreness and swelling (inflammation) of your sinuses. Sinuses are hollow spaces in the bones around your face. They are located: Around your eyes. In the middle of your forehead. Behind your nose. In your cheekbones. Your sinuses and nasal passages are lined with a fluid called mucus. Mucus drains out of your sinuses. Swelling can trap mucus in your sinuses. This lets germs (bacteria, virus, or fungus) grow, which leads to infection. Most of the time, this condition is caused by a virus. What are the causes? This condition is caused by: Allergies. Asthma. Germs. Things that block your nose or sinuses. Growths in the nose (nasal polyps). Chemicals or irritants in the air. Fungus (rare). What increases the risk? You are more likely to develop this condition if: You have a weak body defense system (immune system). You do a lot of swimming or diving. You use nasal sprays too much. You smoke. What are the signs or symptoms? The main symptoms of this condition are pain and a feeling of pressure around the sinuses. Other symptoms include: Stuffy nose (congestion). Runny nose (drainage). Swelling and warmth in the sinuses. Headache. Toothache. A cough that may get worse at night. Mucus that collects in the throat or the back of the nose (postnasal drip). Being unable to smell and taste. Being very tired (fatigue). A fever. Sore throat. Bad breath. How is this diagnosed? This condition is diagnosed based on: Your symptoms. Your medical history. A physical exam. Tests to find out if your condition is short-term (acute) or long-term (chronic). Your doctor may: Check your nose for growths (polyps). Check your sinuses using a tool that has a light (endoscope). Check  for allergies or germs. Do imaging tests, such as an MRI or CT scan. How is this treated? Treatment for this condition depends on the cause and whether it is short-term or long-term. If caused by a virus, your symptoms should go away on their own within 10 days. You may be given medicines to relieve symptoms. They include: Medicines that shrink swollen tissue in the nose. Medicines that treat allergies (antihistamines). A spray that treats swelling of the nostrils.  Rinses that help get rid of thick mucus in your nose (nasal saline washes). If caused by bacteria, your doctor may wait to see if you will get better without treatment. You may be given antibiotic medicine if you have: A very bad infection. A weak body defense system. If caused by growths in the nose, you may need to have surgery. Follow these instructions at home: Medicines Take, use, or apply over-the-counter and prescription medicines only as told by your doctor. These may include nasal sprays. If you were prescribed an antibiotic medicine, take it as told by your doctor. Do not stop taking the antibiotic even if you start to feel better. Hydrate and humidify  Drink enough water to keep your pee (urine) pale yellow. Use a cool mist humidifier to keep the humidity level in your home above 50%. Breathe in steam for 10-15 minutes, 3-4 times a day, or as told by your doctor. You can do this in the bathroom while a hot shower is running. Try not to spend time in cool or dry air. Rest Rest as much as you can. Sleep with your head raised (  elevated). Make sure you get enough sleep each night. General instructions  Put a warm, moist washcloth on your face 3-4 times a day, or as often as told by your doctor. This will help with discomfort. Wash your hands often with soap and water. If there is no soap and water, use hand sanitizer. Do not smoke. Avoid being around people who are smoking (secondhand smoke). Keep all follow-up  visits as told by your doctor. This is important. Contact a doctor if: You have a fever. Your symptoms get worse. Your symptoms do not get better within 10 days. Get help right away if: You have a very bad headache. You cannot stop throwing up (vomiting). You have very bad pain or swelling around your face or eyes. You have trouble seeing. You feel confused. Your neck is stiff. You have trouble breathing. Summary Sinusitis is swelling of your sinuses. Sinuses are hollow spaces in the bones around your face. This condition is caused by tissues in your nose that become inflamed or swollen. This traps germs. These can lead to infection. If you were prescribed an antibiotic medicine, take it as told by your doctor. Do not stop taking it even if you start to feel better. Keep all follow-up visits as told by your doctor. This is important. This information is not intended to replace advice given to you by your health care provider. Make sure you discuss any questions you have with your health care provider. Document Revised: 05/09/2018 Document Reviewed: 05/09/2018 Elsevier Patient Education  2022 [employer].

## 2021-09-25 ENCOUNTER — Other Ambulatory Visit: Payer: Self-pay

## 2021-09-25 NOTE — Telephone Encounter (Signed)
Patient states she has started coughing up yellow film, feels no better, was saw 5 days ago, Covid test negative on 10/03. Cough has gotten worse, burns in chest when coughs, chills, no fever, no headache.  I spoke with Carollee Herter and she recommends patient gives antibiotic time to get in system and do an at home Covid test. Doesn't need to come back in to be seen.  Spoke with patient, verbalized understanding and had no questions at this time. She is going to take an at home Covid test.

## 2021-09-26 ENCOUNTER — Ambulatory Visit: Payer: BC Managed Care – PPO | Admitting: Nurse Practitioner

## 2021-09-26 ENCOUNTER — Telehealth: Payer: Self-pay

## 2021-09-26 NOTE — Telephone Encounter (Signed)
Patient is scheduled for the mobile mammogram bus which is on October 24th at 10:10 AM. Called and left detailed message for patient informing patient of scheduled appointment time and date and if she has any questions or concerns to give [place] a call back. [place]

## 2021-09-29 ENCOUNTER — Other Ambulatory Visit: Payer: Self-pay | Admitting: Nurse Practitioner

## 2021-09-29 DIAGNOSIS — Z1231 Encounter for screening mammogram for malignant neoplasm of breast: Secondary | ICD-10-CM

## 2021-10-01 ENCOUNTER — Ambulatory Visit (INDEPENDENT_AMBULATORY_CARE_PROVIDER_SITE_OTHER): Payer: BC Managed Care – PPO | Admitting: Nurse Practitioner

## 2021-10-01 ENCOUNTER — Encounter: Payer: Self-pay | Admitting: Nurse Practitioner

## 2021-10-01 ENCOUNTER — Other Ambulatory Visit: Payer: Self-pay | Admitting: Nurse Practitioner

## 2021-10-01 VITALS — BP 126/80 | HR 77 | Temp 96.9°F | Wt 324.0 lb

## 2021-10-01 DIAGNOSIS — R051 Acute cough: Secondary | ICD-10-CM | POA: Diagnosis not present

## 2021-10-01 DIAGNOSIS — J0181 Other acute recurrent sinusitis: Secondary | ICD-10-CM | POA: Diagnosis not present

## 2021-10-01 DIAGNOSIS — J4520 Mild intermittent asthma, uncomplicated: Secondary | ICD-10-CM

## 2021-10-01 DIAGNOSIS — Z8619 Personal history of other infectious and parasitic diseases: Secondary | ICD-10-CM

## 2021-10-01 MED ORDER — ALBUTEROL SULFATE HFA 108 (90 BASE) MCG/ACT IN AERS: 2.0000 | INHALATION_SPRAY | Freq: Four times a day (QID) | RESPIRATORY_TRACT | 0 refills | Status: AC | PRN

## 2021-10-01 MED ORDER — FLUCONAZOLE 150 MG PO TABS: 150.0000 mg | ORAL_TABLET | Freq: Once | ORAL | 0 refills | Status: AC

## 2021-10-01 MED ORDER — PROMETHAZINE-DM 6.25-15 MG/5ML PO SYRP: 5.0000 mL | ORAL_SOLUTION | Freq: Four times a day (QID) | ORAL | 0 refills | Status: AC | PRN

## 2021-10-01 NOTE — Progress Notes (Signed)
Acute Office Visit  Subjective:    Patient ID: Erin Henderson, female    DOB: 1976/01/30, 45 y.o.   MRN: [id number]  Chief Complaint  Patient presents with  . Sinusitis    HPI Patient is in today for sinusitis.She was seen in the office on 09/22/21 and prescribed a course of Augmentin. Other treatment includes Claritin, Flonase, Benadryl, and prescription cough syrup. She states symptoms of sinus congestion, post-nasal-drip, and bilateral ear pain have improved. States she continues to have a productive cough with clear to yellow sputum and mild dyspnea. State she is concerned she will get bronchitis. States she has a past history of asthma. Is not currently using an inhaler. Jasline tells me she has vaginal itching from a yeast infection.   Past Medical History:  Diagnosis Date  . Anxiety   . Depression   . GERD with esophagitis   . Hyperlipidemia   . Hypertension   . Hypothyroidism     Past Surgical History:  Procedure Laterality Date  . CHOLECYSTECTOMY    . LAPAROSCOPIC GASTRIC SLEEVE RESECTION    . VAGINAL HYSTERECTOMY      Family History  Problem Relation Age of Onset  . Hypertension Mother   . Hyperlipidemia Mother   . Thyroid disease Mother   . Migraines Mother   . Asthma Father   . Stroke Father   . Diabetes Father   . Hyperlipidemia Father   . Hypertension Father   . Thyroid disease Father   . Liver cancer Father   . Hyperlipidemia Sister   . Hypertension Sister   . Colon cancer Neg Hx   . Rectal cancer Neg Hx     Social History   Socioeconomic History  . Marital status: Married    Spouse name: Not on file  . Number of children: 2  . Years of education: Not on file  . Highest education level: Not on file  Occupational History  . Occupation: school bus driver  Tobacco Use  . Smoking status: Former    Packs/day: 1.00    Types: Cigarettes    Quit date: 2000    Years since quitting: 22.7  . Smokeless tobacco: Never  Vaping Use  . Vaping  Use: Never used  Substance and Sexual Activity  . Alcohol use: Never  . Drug use: Never  . Sexual activity: Not on file  Other Topics Concern  . Not on file  Social History Narrative  . Not on file   Social Determinants of Health   Financial Resource Strain: Not on file  Food Insecurity: Not on file  Transportation Needs: Not on file  Physical Activity: Not on file  Stress: Not on file  Social Connections: Not on file  Intimate Partner Violence: Not on file    Outpatient Medications Prior to Visit  Medication Sig Dispense Refill  . amoxicillin-clavulanate (AUGMENTIN) 875-125 MG tablet Take 1 tablet by mouth 2 (two) times daily. 20 tablet 0  . ARIPiprazole (ABILIFY) 10 MG tablet Take 10 mg by mouth daily.    . fluticasone (FLONASE) 50 MCG/ACT nasal spray Place 2 sprays into both nostrils daily. 16 g 6  . ketorolac (TORADOL) 10 MG tablet TAKE ONE TABLET BY MOUTH ONCE DAILY AS NEEDED FOR MIGRAINES 30 tablet 3  . L-Lysine 500 MG TABS Take by mouth. Once daily    . levothyroxine (SYNTHROID) 100 MCG tablet TAKE 1 TABLET BY MOUTH ONCE DAILY. 30 tablet 6  . losartan (COZAAR) 100 MG tablet TAKE 1  TABLET BY MOUTH ONCE DAILY. 90 tablet 2  . omeprazole (PRILOSEC) 40 MG capsule TAKE 1 CAPSULE BY MOUTH ONCE DAILY. 30 capsule 6  . Oxcarbazepine (TRILEPTAL) 300 MG tablet Take 300 mg by mouth 2 (two) times daily.    . pravastatin (PRAVACHOL) 20 MG tablet TAKE 1 TABLET BY MOUTH ONCE DAILY. 90 tablet 2  . promethazine-dextromethorphan (PROMETHAZINE-DM) 6.25-15 MG/5ML syrup Take 5 mLs by mouth 4 (four) times daily as needed. 118 mL 0  . propranolol (INDERAL) 80 MG tablet TAKE 1 TABLET BY MOUTH ONCE DAILY. 30 tablet 6  . rizatriptan (MAXALT) 10 MG tablet TAKE ONE TABLET BY MOUTH AT ONSET OF HEADACHE. MAY REPEAT IN TWO HOURS IF NEEDED. 30 tablet 6  . sertraline (ZOLOFT) 100 MG tablet Take 100 mg by mouth daily.     No facility-administered medications prior to visit.    Allergies  Allergen  Reactions  . Cefdinir Hives and Nausea And Vomiting  . Risperidone Other (See Comments)    unknown    Review of Systems  Constitutional:  Positive for fatigue. Negative for fever.  HENT:  Positive for congestion, ear pain (Right ear), postnasal drip, rhinorrhea and sore throat (Right side). Negative for sinus pressure and sinus pain.   Eyes:  Negative for pain.  Respiratory:  Positive for cough, chest tightness (mild, intermittent) and shortness of breath.   Cardiovascular:  Negative for chest pain.  Gastrointestinal:  Negative for abdominal pain, diarrhea and nausea.  Endocrine: Negative.   Genitourinary: Negative.        Vaginal itching  Musculoskeletal: Negative.   Allergic/Immunologic: Positive for environmental allergies.  Neurological:  Negative for headaches.  Hematological: Negative.   Psychiatric/Behavioral: Negative.        Objective:    Physical Exam Vitals reviewed.  Constitutional:      Appearance: Normal appearance.  HENT:     Head: Normocephalic.     Right Ear: Tenderness present. Tympanic membrane is erythematous.     Left Ear: Tympanic membrane is erythematous.     Mouth/Throat:     Pharynx: Posterior oropharyngeal erythema present.  Cardiovascular:     Rate and Rhythm: Normal rate and regular rhythm.     Pulses: Normal pulses.     Heart sounds: Normal heart sounds.  Pulmonary:     Effort: Pulmonary effort is normal.     Breath sounds: Normal breath sounds.  Abdominal:     General: Bowel sounds are normal.     Palpations: Abdomen is soft.  Skin:    General: Skin is warm and dry.     Capillary Refill: Capillary refill takes less than 2 seconds.  Neurological:     General: No focal deficit present.     Mental Status: She is alert and oriented to person, place, and time.  Psychiatric:        Mood and Affect: Mood normal.        Behavior: Behavior normal.   Pulse 77   Temp (!) 96.9 F (36.1 C)   Wt (!) 324 lb (147 kg)   SpO2 96%   BMI 53.92  kg/m  BP 126/80   Pulse 77   Temp (!) 96.9 F (36.1 C)   Wt (!) 324 lb (147 kg)   SpO2 96%   BMI 53.92 kg/m   Wt Readings from Last 3 Encounters:  10/01/21 (!) 324 lb (147 kg)  09/22/21 (!) 336 lb (152.4 kg)  09/11/21 (!) 341 lb (154.7 kg)    Health Maintenance  Due  Topic Date Due  . HIV Screening  Never done  . Hepatitis C Screening  Never done  . COLONOSCOPY (Pts 45-24yr Insurance coverage will need to be confirmed)  Never done  . MAMMOGRAM  02/13/2021       Lab Results  Component Value Date   TSH 0.966 09/11/2021   Lab Results  Component Value Date   WBC 9.0 09/11/2021   HGB 12.0 09/11/2021   HCT 37.5 09/11/2021   MCV 81 09/11/2021   PLT 224 09/11/2021   Lab Results  Component Value Date   NA 143 09/11/2021   K 4.4 09/11/2021   CO2 23 09/11/2021   GLUCOSE 90 09/11/2021   BUN 16 09/11/2021   CREATININE 0.79 09/11/2021   BILITOT 0.3 09/11/2021   ALKPHOS 119 09/11/2021   AST 19 09/11/2021   ALT 31 09/11/2021   PROT 6.5 09/11/2021   ALBUMIN 4.2 09/11/2021   CALCIUM 9.2 09/11/2021   EGFR 93 09/11/2021   Lab Results  Component Value Date   CHOL 162 09/11/2021   Lab Results  Component Value Date   HDL 30 (L) 09/11/2021   Lab Results  Component Value Date   LDLCALC 97 09/11/2021   Lab Results  Component Value Date   TRIG 202 (H) 09/11/2021   Lab Results  Component Value Date   CHOLHDL 5.4 (H) 09/11/2021   Lab Results  Component Value Date   HGBA1C 5.1 03/11/2021       Assessment & Plan:   1. History of candidiasis of vagina - fluconazole (DIFLUCAN) 150 MG tablet; Take 1 tablet (150 mg total) by mouth once for 1 dose.  Dispense: 2 tablet; Refill: 0  2. Acute cough - promethazine-dextromethorphan (PROMETHAZINE-DM) 6.25-15 MG/5ML syrup; Take 5 mLs by mouth 4 (four) times daily as needed.  Dispense: 118 mL; Refill: 0  3. Other acute recurrent sinusitis - promethazine-dextromethorphan (PROMETHAZINE-DM) 6.25-15 MG/5ML syrup; Take 5 mLs  by mouth 4 (four) times daily as needed.  Dispense: 118 mL; Refill: 0  4. Mild intermittent asthma without complication - albuterol (VENTOLIN HFA) 108 (90 Base) MCG/ACT inhaler; Inhale 2 puffs into the lungs every 6 (six) hours as needed for wheezing or shortness of breath.  Dispense: 8 g; Refill: 0    Take Mucinex as directed Push fluids, especially water Continue Flonase, Claritin, and Augmentin Use Albuterol inhaler as needed Take cough syrup up to four times daily as needed Warm salt water gargles, throat lozenges, and Ibuprofen/Tylenol as needed for sore throat     Follow-up: PRN  An After Visit Summary was printed and given to the patient.  I, SRip Harbour NP, have reviewed all documentation for this visit. The documentation on 10/01/21 for the exam, diagnosis, procedures, and orders are all accurate and complete.    Signed, SRip Harbour NP CNorth [phone]

## 2021-10-01 NOTE — Patient Instructions (Signed)
Take Mucinex as directed Push fluids, especially water Continue Flonase, Claritin, and Augmentin Use Albuterol inhaler as needed Take cough syrup up to four times daily as needed Warm salt water gargles, throat lozenges, and Ibuprofen/Tylenol as needed for sore throat  Sinusitis, Adult Sinusitis is soreness and swelling (inflammation) of your sinuses. Sinuses are hollow spaces in the bones around your face. They are located: Around your eyes. In the middle of your forehead. Behind your nose. In your cheekbones. Your sinuses and nasal passages are lined with a fluid called mucus. Mucus drains out of your sinuses. Swelling can trap mucus in your sinuses. This lets germs (bacteria, virus, or fungus) grow, which leads to infection. Most of the time, this condition is caused by a virus. What are the causes? This condition is caused by: Allergies. Asthma. Germs. Things that block your nose or sinuses. Growths in the nose (nasal polyps). Chemicals or irritants in the air. Fungus (rare). What increases the risk? You are more likely to develop this condition if: You have a weak body defense system (immune system). You do a lot of swimming or diving. You use nasal sprays too much. You smoke. What are the signs or symptoms? The main symptoms of this condition are pain and a feeling of pressure around the sinuses. Other symptoms include: Stuffy nose (congestion). Runny nose (drainage). Swelling and warmth in the sinuses. Headache. Toothache. A cough that may get worse at night. Mucus that collects in the throat or the back of the nose (postnasal drip). Being unable to smell and taste. Being very tired (fatigue). A fever. Sore throat. Bad breath. How is this diagnosed? This condition is diagnosed based on: Your symptoms. Your medical history. A physical exam. Tests to find out if your condition is short-term (acute) or long-term (chronic). Your doctor may: Check your nose for  growths (polyps). Check your sinuses using a tool that has a light (endoscope). Check for allergies or germs. Do imaging tests, such as an MRI or CT scan. How is this treated? Treatment for this condition depends on the cause and whether it is short-term or long-term. If caused by a virus, your symptoms should go away on their own within 10 days. You may be given medicines to relieve symptoms. They include: Medicines that shrink swollen tissue in the nose. Medicines that treat allergies (antihistamines). A spray that treats swelling of the nostrils.  Rinses that help get rid of thick mucus in your nose (nasal saline washes). If caused by bacteria, your doctor may wait to see if you will get better without treatment. You may be given antibiotic medicine if you have: A very bad infection. A weak body defense system. If caused by growths in the nose, you may need to have surgery. Follow these instructions at home: Medicines Take, use, or apply over-the-counter and prescription medicines only as told by your doctor. These may include nasal sprays. If you were prescribed an antibiotic medicine, take it as told by your doctor. Do not stop taking the antibiotic even if you start to feel better. Hydrate and humidify  Drink enough water to keep your pee (urine) pale yellow. Use a cool mist humidifier to keep the humidity level in your home above 50%. Breathe in steam for 10-15 minutes, 3-4 times a day, or as told by your doctor. You can do this in the bathroom while a hot shower is running. Try not to spend time in cool or dry air. Rest Rest as much as you can.  Sleep with your head raised (elevated). Make sure you get enough sleep each night. General instructions  Put a warm, moist washcloth on your face 3-4 times a day, or as often as told by your doctor. This will help with discomfort. Wash your hands often with soap and water. If there is no soap and water, use hand sanitizer. Do not  smoke. Avoid being around people who are smoking (secondhand smoke). Keep all follow-up visits as told by your doctor. This is important. Contact a doctor if: You have a fever. Your symptoms get worse. Your symptoms do not get better within 10 days. Get help right away if: You have a very bad headache. You cannot stop throwing up (vomiting). You have very bad pain or swelling around your face or eyes. You have trouble seeing. You feel confused. Your neck is stiff. You have trouble breathing. Summary Sinusitis is swelling of your sinuses. Sinuses are hollow spaces in the bones around your face. This condition is caused by tissues in your nose that become inflamed or swollen. This traps germs. These can lead to infection. If you were prescribed an antibiotic medicine, take it as told by your doctor. Do not stop taking it even if you start to feel better. Keep all follow-up visits as told by your doctor. This is important. This information is not intended to replace advice given to you by your health care provider. Make sure you discuss any questions you have with your health care provider. Document Revised: 05/09/2018 Document Reviewed: 05/09/2018 Elsevier Patient Education  2022 [employer].

## 2021-10-07 ENCOUNTER — Encounter: Payer: Self-pay | Admitting: Nurse Practitioner

## 2021-10-07 ENCOUNTER — Ambulatory Visit (INDEPENDENT_AMBULATORY_CARE_PROVIDER_SITE_OTHER): Payer: BC Managed Care – PPO | Admitting: Nurse Practitioner

## 2021-10-07 VITALS — BP 124/78 | HR 73 | Temp 97.5°F | Ht 65.0 in | Wt 337.0 lb

## 2021-10-07 DIAGNOSIS — M542 Cervicalgia: Secondary | ICD-10-CM

## 2021-10-07 DIAGNOSIS — J301 Allergic rhinitis due to pollen: Secondary | ICD-10-CM

## 2021-10-07 DIAGNOSIS — R051 Acute cough: Secondary | ICD-10-CM | POA: Diagnosis not present

## 2021-10-07 LAB — POCT INFLUENZA A/B
Influenza A, POC: NEGATIVE
Influenza B, POC: NEGATIVE

## 2021-10-07 MED ORDER — TRIAMCINOLONE ACETONIDE 40 MG/ML IJ SUSP
60.0000 mg | Freq: Once | INTRAMUSCULAR | Status: AC
  Administered 2021-10-07: 60 mg via INTRAMUSCULAR

## 2021-10-07 MED ORDER — KETOROLAC TROMETHAMINE 60 MG/2ML IM SOLN
60.0000 mg | Freq: Once | INTRAMUSCULAR | Status: AC
  Administered 2021-10-07: 60 mg via INTRAMUSCULAR

## 2021-10-07 NOTE — Progress Notes (Signed)
Acute Office Visit  Subjective:    Patient ID: Erin Henderson, female    DOB: [birth year], 45 y.o.   MRN: [id number]  Chief Complaint  Patient presents with   URI    HPI: Erin Henderson is a 45 year old Caucasian female that presents with anterior neck pain. She was recently treated with a course of Augmentin for sinusitis on 09/22/21. She returned to the office on 10/01/21 with cough and vaginal candidiasis. She was prescribed Diflucan, Albuterol inhaler for intermittent asthma, and recommended to take Mucinex. Other treatment includes Claritin, Flonase, Benadryl, and prescription cough syrup. She has returned today stating she does not feel better. States her spouse thinks she needs another antibiotic. She denies fever, sore throat, chest pain, dyspnea, wheezing, headache, or sinus tenderness. She tells me she is experiencing anterior neck pain that is aggravated by chewing, smiling, and laughing. States pain radiates to face. Denies previous history of TMJ.She does have a history of thyroid disorder.   Neck Pain  She reports new onset neck pain. There was not an injury that may have caused the pain. The most recent episode started a few days ago and is staying constant. The pain is located in the front of neckwith radiation to bilateral ears. It is described as soreness, is 7/10 in intensity, occurring intermittently. Symptoms are worse in the: all times of the day.  Aggravating factors: turning right, turning left Relieving factors: none.  She has tried application of heat and NSAIDs with no relief.   Associated symptoms: No chest pain No fever  No headaches No joint pains  No numbness in none No sore throat  No swallowing problems No tingling in none  No weakness in none        Past Medical History:  Diagnosis Date   Anxiety    Depression    GERD with esophagitis    Hyperlipidemia    Hypertension    Hypothyroidism     Past Surgical History:  Procedure Laterality Date    CHOLECYSTECTOMY     LAPAROSCOPIC GASTRIC SLEEVE RESECTION     VAGINAL HYSTERECTOMY      Family History  Problem Relation Age of Onset   Hypertension Mother    Hyperlipidemia Mother    Thyroid disease Mother    Migraines Mother    Asthma Father    Stroke Father    Diabetes Father    Hyperlipidemia Father    Hypertension Father    Thyroid disease Father    Liver cancer Father    Hyperlipidemia Sister    Hypertension Sister    Colon cancer Neg Hx    Rectal cancer Neg Hx     Social History   Socioeconomic History   Marital status: Married    Spouse name: Not on file   Number of children: 2   Years of education: Not on file   Highest education level: Not on file  Occupational History   Occupation: school bus driver  Tobacco Use   Smoking status: Former    Packs/day: 1.00    Types: Cigarettes    Quit date: 2000    Years since quitting: 22.8   Smokeless tobacco: Never  Vaping Use   Vaping Use: Never used  Substance and Sexual Activity   Alcohol use: Never   Drug use: Never   Sexual activity: Not on file  Other Topics Concern   Not on file  Social History Narrative   Not on file   Social Determinants of Health  Financial Resource Strain: Not on file  Food Insecurity: Not on file  Transportation Needs: Not on file  Physical Activity: Not on file  Stress: Not on file  Social Connections: Not on file  Intimate Partner Violence: Not on file    Outpatient Medications Prior to Visit  Medication Sig Dispense Refill   albuterol (VENTOLIN HFA) 108 (90 Base) MCG/ACT inhaler Inhale 2 puffs into the lungs every 6 (six) hours as needed for wheezing or shortness of breath. 8 g 0   amoxicillin-clavulanate (AUGMENTIN) 875-125 MG tablet Take 1 tablet by mouth 2 (two) times daily. 20 tablet 0   ARIPiprazole (ABILIFY) 10 MG tablet Take 10 mg by mouth daily.     fluticasone (FLONASE) 50 MCG/ACT nasal spray Place 2 sprays into both nostrils daily. 16 g 6   ketorolac (TORADOL)  10 MG tablet TAKE ONE TABLET BY MOUTH ONCE DAILY AS NEEDED FOR MIGRAINES 30 tablet 3   L-Lysine 500 MG TABS Take by mouth. Once daily     levothyroxine (SYNTHROID) 100 MCG tablet TAKE 1 TABLET BY MOUTH ONCE DAILY. 30 tablet 6   losartan (COZAAR) 100 MG tablet TAKE 1 TABLET BY MOUTH ONCE DAILY. 90 tablet 2   omeprazole (PRILOSEC) 40 MG capsule TAKE 1 CAPSULE BY MOUTH ONCE DAILY. 30 capsule 6   Oxcarbazepine (TRILEPTAL) 300 MG tablet Take 300 mg by mouth 2 (two) times daily.     pravastatin (PRAVACHOL) 20 MG tablet TAKE 1 TABLET BY MOUTH ONCE DAILY. 90 tablet 2   promethazine-dextromethorphan (PROMETHAZINE-DM) 6.25-15 MG/5ML syrup Take 5 mLs by mouth 4 (four) times daily as needed. 118 mL 0   promethazine-dextromethorphan (PROMETHAZINE-DM) 6.25-15 MG/5ML syrup Take 5 mLs by mouth 4 (four) times daily as needed. 118 mL 0   propranolol (INDERAL) 80 MG tablet TAKE 1 TABLET BY MOUTH ONCE DAILY. 30 tablet 6   rizatriptan (MAXALT) 10 MG tablet TAKE ONE TABLET BY MOUTH AT ONSET OF HEADACHE. MAY REPEAT IN TWO HOURS IF NEEDED. 30 tablet 6   sertraline (ZOLOFT) 100 MG tablet Take 100 mg by mouth daily.     No facility-administered medications prior to visit.    Allergies  Allergen Reactions   Cefdinir Hives and Nausea And Vomiting   Risperidone Other (See Comments)    unknown    Review of Systems  Constitutional:  Positive for fatigue. Negative for chills and fever.  HENT:  Positive for congestion, ear pain (Rt ear) and postnasal drip. Negative for rhinorrhea, sinus pressure, sinus pain and sore throat.   Eyes: Negative.   Respiratory:  Negative for cough, chest tightness, shortness of breath and wheezing.   Cardiovascular:  Negative for chest pain.  Endocrine: Negative.   Genitourinary: Negative.   Musculoskeletal:  Positive for neck pain.  Skin: Negative.   Allergic/Immunologic: Positive for environmental allergies.  Neurological:  Negative for dizziness and headaches.  Hematological:  Negative.   Psychiatric/Behavioral: Negative.        Objective:   BP 124/78   Pulse 73   Temp (!) 97.5 F (36.4 C)   Ht 5' 5"  (1.651 m)   Wt (!) 337 lb (152.9 kg)   SpO2 93%   BMI 56.08 kg/m   Physical Exam Vitals reviewed.  Constitutional:      Appearance: Normal appearance.  HENT:     Head: Normocephalic.     Right Ear: Tympanic membrane normal.     Left Ear: Tympanic membrane normal.     Nose: Nose normal.  Mouth/Throat:     Mouth: Mucous membranes are moist.  Eyes:     Pupils: Pupils are equal, round, and reactive to light.  Neck:     Thyroid: No thyroid mass, thyromegaly or thyroid tenderness.     Vascular: No carotid bruit.     Trachea: Trachea normal.  Cardiovascular:     Rate and Rhythm: Normal rate and regular rhythm.     Pulses: Normal pulses.     Heart sounds: Normal heart sounds.  Pulmonary:     Effort: Pulmonary effort is normal.     Breath sounds: Normal breath sounds.  Abdominal:     General: Bowel sounds are normal.     Palpations: Abdomen is soft.  Musculoskeletal:        General: Tenderness (anterior neck) present.     Cervical back: Neck supple. No edema, erythema or rigidity. Muscular tenderness present. No spinous process tenderness.  Lymphadenopathy:     Cervical: No cervical adenopathy.  Skin:    General: Skin is warm and dry.     Capillary Refill: Capillary refill takes less than 2 seconds.  Neurological:     General: No focal deficit present.     Mental Status: She is alert and oriented to person, place, and time.  Psychiatric:        Mood and Affect: Mood normal.        Behavior: Behavior normal.     Wt Readings from Last 3 Encounters:  10/01/21 (!) 324 lb (147 kg)  09/22/21 (!) 336 lb (152.4 kg)  09/11/21 (!) 341 lb (154.7 kg)    Health Maintenance Due  Topic Date Due   HIV Screening  Never done   Hepatitis C Screening  Never done   COLONOSCOPY (Pts 45-1yr Insurance coverage will need to be confirmed)  Never done    MAMMOGRAM  02/13/2021       Lab Results  Component Value Date   TSH 0.966 09/11/2021   Lab Results  Component Value Date   WBC 9.0 09/11/2021   HGB 12.0 09/11/2021   HCT 37.5 09/11/2021   MCV 81 09/11/2021   PLT 224 09/11/2021   Lab Results  Component Value Date   NA 143 09/11/2021   K 4.4 09/11/2021   CO2 23 09/11/2021   GLUCOSE 90 09/11/2021   BUN 16 09/11/2021   CREATININE 0.79 09/11/2021   BILITOT 0.3 09/11/2021   ALKPHOS 119 09/11/2021   AST 19 09/11/2021   ALT 31 09/11/2021   PROT 6.5 09/11/2021   ALBUMIN 4.2 09/11/2021   CALCIUM 9.2 09/11/2021   EGFR 93 09/11/2021   Lab Results  Component Value Date   CHOL 162 09/11/2021   Lab Results  Component Value Date   HDL 30 (L) 09/11/2021   Lab Results  Component Value Date   LDLCALC 97 09/11/2021   Lab Results  Component Value Date   TRIG 202 (H) 09/11/2021   Lab Results  Component Value Date   CHOLHDL 5.4 (H) 09/11/2021   Lab Results  Component Value Date   HGBA1C 5.1 03/11/2021       Assessment & Plan:   1. Neck pain - ketorolac (TORADOL) injection 60 mg - triamcinolone acetonide (KENALOG-40) injection 60 mg [facility] -neck exercises daily as tolerated -continue Tylenol/Ibuprofen as needed  2. Allergic rhinitis due to pollen, unspecified seasonality -continue Flonase -continue Claritin  3. Acute cough - POCT Influenza A/B-NEGATIVE -continue Albuterol inhaler as needed -continue cough syrup  as needed    Obtain neck x-ray at [facility] today Kenalog steroid injection and Toradol anti-inflammatory injection given in office today Rotate heat and ice to neck as needed, 20 minutes on, 20 minutes off Follow-up pending neck x-ray results     Follow-up: pending neck x-ray  An After Visit Summary was printed and given to the patient.  Rip Harbour, NP [place] [phone]

## 2021-10-07 NOTE — Patient Instructions (Addendum)
Obtain neck x-ray at [facility] today Kenalog steroid injection and Toradol anti-inflammatory injection given in office today Rotate heat and ice to neck as needed, 20 minutes on, 20 minutes off Follow-up pending neck x-ray results    Neck Exercises Ask your health care provider which exercises are safe for you. Do exercises exactly as told by your health care provider and adjust them as directed. It is normal to feel mild stretching, pulling, tightness, or discomfort as you do these exercises. Stop right away if you feel sudden pain or your pain gets worse. Do not begin these exercises until told by your health care provider. Neck exercises can be important for many reasons. They can improve strength and maintain flexibility in your neck, which will help your upper back and prevent neck pain. Stretching exercises Rotation neck stretching  Sit in a chair or stand up. Place your feet flat on the floor, shoulder width apart. Slowly turn your head (rotate) to the right until a slight stretch is felt. Turn it all the way to the right so you can look over your right shoulder. Do not tilt or tip your head. Hold this position for 10-30 seconds. Slowly turn your head (rotate) to the left until a slight stretch is felt. Turn it all the way to the left so you can look over your left shoulder. Do not tilt or tip your head. Hold this position for 10-30 seconds. Repeat __________ times. Complete this exercise __________ times a day. Neck retraction Sit in a sturdy chair or stand up. Look straight ahead. Do not bend your neck. Use your fingers to push your chin backward (retraction). Do not bend your neck for this movement. Continue to face straight ahead. If you are doing the exercise properly, you will feel a slight sensation in your throat and a stretch at the back of your neck. Hold the stretch for 1-2 seconds. Repeat __________ times. Complete this exercise __________ times a  day. Strengthening exercises Neck press Lie on your back on a firm bed or on the floor with a pillow under your head. Use your neck muscles to push your head down on the pillow and straighten your spine. Hold the position as well as you can. Keep your head facing up (in a neutral position) and your chin tucked. Slowly count to 5 while holding this position. Repeat __________ times. Complete this exercise __________ times a day. Isometrics These are exercises in which you strengthen the muscles in your neck while keeping your neck still (isometrics). Sit in a supportive chair and place your hand on your forehead. Keep your head and face facing straight ahead. Do not flex or extend your neck while doing isometrics. Push forward with your head and neck while pushing back with your hand. Hold for 10 seconds. Do the sequence again, this time putting your hand against the back of your head. Use your head and neck to push backward against the hand pressure. Finally, do the same exercise on either side of your head, pushing sideways against the pressure of your hand. Repeat __________ times. Complete this exercise __________ times a day. Prone head lifts Lie face-down (prone position), resting on your elbows so that your chest and upper back are raised. Start with your head facing downward, near your chest. Position your chin either on or near your chest. Slowly lift your head upward. Lift until you are looking straight ahead. Then continue lifting your head as far back as you can comfortably stretch.  Hold your head up for 5 seconds. Then slowly lower it to your starting position. Repeat __________ times. Complete this exercise __________ times a day. Supine head lifts Lie on your back (supine position), bending your knees to point to the ceiling and keeping your feet flat on the floor. Lift your head slowly off the floor, raising your chin toward your chest. Hold for 5 seconds. Repeat __________  times. Complete this exercise __________ times a day. Scapular retraction Stand with your arms at your sides. Look straight ahead. Slowly pull both shoulders (scapulae) backward and downward (retraction) until you feel a stretch between your shoulder blades in your upper back. Hold for 10-30 seconds. Relax and repeat. Repeat __________ times. Complete this exercise __________ times a day. Contact a health care provider if: Your neck pain or discomfort gets much worse when you do an exercise. Your neck pain or discomfort does not improve within 2 hours after you exercise. If you have any of these problems, stop exercising right away. Do not do the exercises again unless your health care provider says that you can. Get help right away if: You develop sudden, severe neck pain. If this happens, stop exercising right away. Do not do the exercises again unless your health care provider says that you can. This information is not intended to replace advice given to you by your health care provider. Make sure you discuss any questions you have with your health care provider. Document Revised: 10/05/2018 Document Reviewed: 10/05/2018 Elsevier Patient Education  2022 [employer].

## 2021-10-09 ENCOUNTER — Other Ambulatory Visit: Payer: Self-pay | Admitting: Nurse Practitioner

## 2021-10-09 DIAGNOSIS — M542 Cervicalgia: Secondary | ICD-10-CM

## 2021-10-09 MED ORDER — CYCLOBENZAPRINE HCL 10 MG PO TABS: 10.0000 mg | ORAL_TABLET | Freq: Three times a day (TID) | ORAL | 0 refills | Status: AC | PRN

## 2021-10-13 ENCOUNTER — Other Ambulatory Visit: Payer: Self-pay

## 2021-10-13 ENCOUNTER — Ambulatory Visit
Admission: RE | Admit: 2021-10-13 | Discharge: 2021-10-13 | Disposition: A | Discharge status: Home / Self Care | Payer: BC Managed Care – PPO | Source: Ambulatory Visit | Attending: Legal Medicine | Admitting: Legal Medicine

## 2021-10-14 ENCOUNTER — Encounter: Payer: Self-pay | Admitting: Legal Medicine

## 2021-11-21 ENCOUNTER — Encounter (HOSPITAL_COMMUNITY): Payer: Self-pay | Admitting: Gastroenterology

## 2021-11-21 NOTE — Progress Notes (Signed)
Attempted to obtain medical history via telephone, unable to reach at this time. I left a voicemail to return pre surgical testing department's phone call.  

## 2021-11-28 ENCOUNTER — Other Ambulatory Visit: Payer: Self-pay | Admitting: Legal Medicine

## 2021-12-02 NOTE — Anesthesia Preprocedure Evaluation (Addendum)
Anesthesia Evaluation  Patient identified by MRN, date of birth, ID band Patient awake    Reviewed: Allergy & Precautions, NPO status , Patient's Chart, lab work & pertinent test results  History of Anesthesia Complications Negative for: history of anesthetic complications  Airway Mallampati: III  TM Distance: >3 FB Neck ROM: Full    Dental  (+) Missing   Pulmonary asthma , sleep apnea and Continuous Positive Airway Pressure Ventilation , former smoker,    Pulmonary exam normal        Cardiovascular hypertension, Pt. on medications and Pt. on home beta blockers negative cardio ROS Normal cardiovascular exam     Neuro/Psych  Headaches, Anxiety Depression    GI/Hepatic Neg liver ROS, GERD  ,  Endo/Other  Hypothyroidism Morbid obesity  Renal/GU negative Renal ROS  negative genitourinary   Musculoskeletal  (+) Arthritis ,   Abdominal   Peds  Hematology negative hematology ROS (+)   Anesthesia Other Findings   Reproductive/Obstetrics                           Anesthesia Physical Anesthesia Plan  ASA: 3  Anesthesia Plan: MAC   Post-op Pain Management: Minimal or no pain anticipated   Induction: Intravenous  PONV Risk Score and Plan: 2 and Propofol infusion, TIVA and Treatment may vary due to age or medical condition  Airway Management Planned: Natural Airway, Nasal Cannula and Simple Face Mask  Additional Equipment: None  Intra-op Plan:   Post-operative Plan:   Informed Consent: I have reviewed the patients History and Physical, chart, labs and discussed the procedure including the risks, benefits and alternatives for the proposed anesthesia with the patient or authorized representative who has indicated his/her understanding and acceptance.       Plan Discussed with:   Anesthesia Plan Comments:        Anesthesia Quick Evaluation

## 2021-12-03 ENCOUNTER — Encounter (HOSPITAL_COMMUNITY): Payer: Self-pay | Admitting: Gastroenterology

## 2021-12-03 ENCOUNTER — Encounter (HOSPITAL_COMMUNITY)
Admission: RE | Disposition: A | Discharge status: Home / Self Care | Payer: Self-pay | Source: Ambulatory Visit | Attending: Gastroenterology

## 2021-12-03 ENCOUNTER — Ambulatory Visit (HOSPITAL_COMMUNITY)
Admission: RE | Admit: 2021-12-03 | Discharge: 2021-12-03 | Disposition: A | Discharge status: Home / Self Care | Payer: BC Managed Care – PPO | Source: Ambulatory Visit | Attending: Gastroenterology | Admitting: Gastroenterology

## 2021-12-03 ENCOUNTER — Ambulatory Visit (HOSPITAL_COMMUNITY): Payer: BC Managed Care – PPO | Admitting: Anesthesiology

## 2021-12-03 DIAGNOSIS — K219 Gastro-esophageal reflux disease without esophagitis: Secondary | ICD-10-CM

## 2021-12-03 DIAGNOSIS — Z9884 Bariatric surgery status: Secondary | ICD-10-CM | POA: Diagnosis not present

## 2021-12-03 DIAGNOSIS — K5730 Diverticulosis of large intestine without perforation or abscess without bleeding: Secondary | ICD-10-CM | POA: Diagnosis not present

## 2021-12-03 DIAGNOSIS — R1031 Right lower quadrant pain: Secondary | ICD-10-CM

## 2021-12-03 DIAGNOSIS — Z6841 Body Mass Index (BMI) 40.0 and over, adult: Secondary | ICD-10-CM | POA: Insufficient documentation

## 2021-12-03 DIAGNOSIS — K2100 Gastro-esophageal reflux disease with esophagitis, without bleeding: Secondary | ICD-10-CM | POA: Insufficient documentation

## 2021-12-03 DIAGNOSIS — Z1211 Encounter for screening for malignant neoplasm of colon: Secondary | ICD-10-CM

## 2021-12-03 DIAGNOSIS — K2970 Gastritis, unspecified, without bleeding: Secondary | ICD-10-CM | POA: Diagnosis not present

## 2021-12-03 DIAGNOSIS — Z903 Acquired absence of stomach [part of]: Secondary | ICD-10-CM

## 2021-12-03 DIAGNOSIS — R195 Other fecal abnormalities: Secondary | ICD-10-CM

## 2021-12-03 DIAGNOSIS — R197 Diarrhea, unspecified: Secondary | ICD-10-CM

## 2021-12-03 DIAGNOSIS — K2990 Gastroduodenitis, unspecified, without bleeding: Secondary | ICD-10-CM

## 2021-12-03 HISTORY — PX: COLONOSCOPY WITH PROPOFOL: SHX5780

## 2021-12-03 HISTORY — PX: ESOPHAGOGASTRODUODENOSCOPY (EGD) WITH PROPOFOL: SHX5813

## 2021-12-03 HISTORY — PX: BIOPSY: SHX5522

## 2021-12-03 SURGERY — COLONOSCOPY WITH PROPOFOL
Anesthesia: Monitor Anesthesia Care

## 2021-12-03 MED ORDER — EPHEDRINE SULFATE 50 MG/ML IJ SOLN
INTRAMUSCULAR | Status: DC | PRN
  Administered 2021-12-03 (×2): 5 mg via INTRAVENOUS

## 2021-12-03 MED ORDER — SODIUM CHLORIDE 0.9 % IV SOLN: INTRAVENOUS | Status: DC

## 2021-12-03 MED ORDER — DEXMEDETOMIDINE (PRECEDEX) IN NS 20 MCG/5ML (4 MCG/ML) IV SYRINGE
PREFILLED_SYRINGE | INTRAVENOUS | Status: DC | PRN
  Administered 2021-12-03: 8 ug via INTRAVENOUS

## 2021-12-03 MED ORDER — PROPOFOL 500 MG/50ML IV EMUL
INTRAVENOUS | Status: AC
  Filled 2021-12-03: qty 150

## 2021-12-03 MED ORDER — PROPOFOL 500 MG/50ML IV EMUL
INTRAVENOUS | Status: DC | PRN
  Administered 2021-12-03: 125 ug/kg/min via INTRAVENOUS

## 2021-12-03 MED ORDER — LIDOCAINE HCL (CARDIAC) PF 100 MG/5ML IV SOSY
PREFILLED_SYRINGE | INTRAVENOUS | Status: DC | PRN
  Administered 2021-12-03: 100 mg via INTRAVENOUS

## 2021-12-03 MED ORDER — PROPOFOL 10 MG/ML IV BOLUS
INTRAVENOUS | Status: DC | PRN
  Administered 2021-12-03: 80 mg via INTRAVENOUS
  Administered 2021-12-03: 30 mg via INTRAVENOUS

## 2021-12-03 MED ORDER — LACTATED RINGERS IV SOLN: INTRAVENOUS | Status: DC

## 2021-12-03 SURGICAL SUPPLY — 25 items

## 2021-12-03 NOTE — Interval H&P Note (Signed)
History and Physical Interval Note:  12/03/2021 8:27 AM  Erin Henderson  has presented today for surgery, with the diagnosis of Genella Rife, change in stools, Gerd, obesity, colorectal screening..  The various methods of treatment have been discussed with the patient and family. After consideration of risks, benefits and other options for treatment, the patient has consented to  Procedure(s): COLONOSCOPY WITH PROPOFOL (N/A) ESOPHAGOGASTRODUODENOSCOPY (EGD) WITH PROPOFOL (N/A) as a surgical intervention.  The patient's history has been reviewed, patient examined, no change in status, stable for surgery.  I have reviewed the patient's chart and labs.  Questions were answered to the patient's satisfaction.     Verlin Dike Rohn Fritsch

## 2021-12-03 NOTE — H&P (Signed)
GASTROENTEROLOGY PROCEDURE H&P NOTE   Primary Care Physician: Abigail Miyamoto, MD    Reason for Procedure:  RLQ pain, colon cancer screening, change in bowel habits, GERD  Plan:    EGD, colonoscopy  Patient is appropriate for endoscopic procedure(s) in the ambulatory ([facility]) setting.  The nature of the procedure, as well as the risks, benefits, and alternatives were carefully and thoroughly reviewed with the patient. Ample time for discussion and questions allowed. The patient understood, was satisfied, and agreed to proceed.     HPI: Erin Henderson is a 45 y.o. female who presents for EGD and colonoscopy for evaluation of routine colon cancer screening, RLQ pain, change in bowel habits, and GERD in the setting of prior gastric sleeve.  Due to morbid obesity (BMI 57), procedure scheduled at [facility] endoscopy unit today for elevated periprocedural risks.  Past Medical History:  Diagnosis Date   Anxiety    Depression    GERD with esophagitis    Hyperlipidemia    Hypertension    Hypothyroidism     Past Surgical History:  Procedure Laterality Date   BREAST EXCISIONAL BIOPSY Right    years ago- benign ?in [place]   CHOLECYSTECTOMY     LAPAROSCOPIC GASTRIC SLEEVE RESECTION     VAGINAL HYSTERECTOMY      Prior to Admission medications   Medication Sig Start Date End Date Taking? Authorizing Provider  ARIPiprazole (ABILIFY) 10 MG tablet Take 10 mg by mouth daily.   Yes [provider]  fluticasone (FLONASE) 50 MCG/ACT nasal spray Place 2 sprays into both nostrils daily. Patient taking differently: Place 1 spray into both nostrils 2 (two) times daily. 09/22/21  Yes Janie Morning, NP  ketorolac (TORADOL) 10 MG tablet Take 10 mg by mouth daily as needed (migraines). 11/06/21  Yes [provider]  L-Lysine 1000 MG TABS Take 1,000 mg by mouth daily. Once daily   Yes [provider]  levothyroxine (SYNTHROID) 100 MCG tablet TAKE 1 TABLET  BY MOUTH EVERY MORNING ON AN EMPTY STOMACH. 11/28/21  Yes Abigail Miyamoto, MD  loratadine (CLARITIN) 10 MG tablet Take 10 mg by mouth daily.   Yes [provider]  losartan (COZAAR) 100 MG tablet TAKE 1 TABLET BY MOUTH ONCE DAILY. 04/13/21  Yes Abigail Miyamoto, MD  omeprazole (PRILOSEC) 40 MG capsule TAKE 1 CAPSULE BY MOUTH ONCE DAILY. 07/31/21  Yes Abigail Miyamoto, MD  Oxcarbazepine (TRILEPTAL) 300 MG tablet Take 300 mg by mouth 2 (two) times daily. 08/15/20  Yes [provider]  pravastatin (PRAVACHOL) 20 MG tablet TAKE 1 TABLET BY MOUTH ONCE DAILY. 04/13/21  Yes Abigail Miyamoto, MD  propranolol (INDERAL) 80 MG tablet TAKE 1 TABLET BY MOUTH ONCE DAILY. 07/31/21  Yes Abigail Miyamoto, MD  rizatriptan (MAXALT) 10 MG tablet TAKE ONE TABLET BY MOUTH AT ONSET OF HEADACHE. MAY REPEAT IN TWO HOURS IF NEEDED. 03/31/21  Yes Abigail Miyamoto, MD  sertraline (ZOLOFT) 100 MG tablet Take 100 mg by mouth 2 (two) times daily.   Yes [provider]  albuterol (VENTOLIN HFA) 108 (90 Base) MCG/ACT inhaler Inhale 2 puffs into the lungs every 6 (six) hours as needed for wheezing or shortness of breath. 10/01/21   Janie Morning, NP  cyclobenzaprine (FLEXERIL) 10 MG tablet Take 1 tablet (10 mg total) by mouth 3 (three) times daily as needed for muscle spasms. Patient not taking: Reported on 11/28/2021 10/09/21   Janie Morning, NP  promethazine-dextromethorphan (PROMETHAZINE-DM) 6.25-15 MG/5ML syrup Take 5 mLs by mouth 4 (four) times daily as needed. Patient not taking: Reported on 11/28/2021 10/01/21   Janie Morning, NP    Current Facility-Administered Medications  Medication Dose Route Frequency Provider Last Rate Last Admin   0.9 %  sodium chloride infusion   Intravenous Continuous Korrina Zern V, DO       0.9 %  sodium chloride infusion   Intravenous Continuous Sundy Houchins V, DO       lactated ringers infusion   Intravenous Continuous  Edwyna Dangerfield V, DO        Allergies as of 09/09/2021 - Review Complete 09/09/2021  Allergen Reaction Noted   Cefdinir Hives and Nausea And Vomiting 12/31/2014   Risperidone Other (See Comments) 02/14/2015    Family History  Problem Relation Age of Onset   Hypertension Mother    Hyperlipidemia Mother    Thyroid disease Mother    Migraines Mother    Asthma Father    Stroke Father    Diabetes Father    Hyperlipidemia Father    Hypertension Father    Thyroid disease Father    Liver cancer Father    Hyperlipidemia Sister    Hypertension Sister    Breast cancer Maternal Aunt    Breast cancer Maternal Aunt    Breast cancer Maternal Grandmother    Colon cancer Neg Hx    Rectal cancer Neg Hx     Social History   Socioeconomic History   Marital status: Married    Spouse name: Not on file   Number of children: 2   Years of education: Not on file   Highest education level: Not on file  Occupational History   Occupation: school bus driver  Tobacco Use   Smoking status: Former    Packs/day: 1.00    Types: Cigarettes    Quit date: 2000    Years since quitting: 22.9   Smokeless tobacco: Never  Vaping Use   Vaping Use: Never used  Substance and Sexual Activity   Alcohol use: Never   Drug use: Never   Sexual activity: Not on file  Other Topics Concern   Not on file  Social History Narrative   Not on file   Social Determinants of Health   Financial Resource Strain: Not on file  Food Insecurity: Not on file  Transportation Needs: Not on file  Physical Activity: Not on file  Stress: Not on file  Social Connections: Not on file  Intimate Partner Violence: Not on file    Physical Exam: Vital signs in last 24 hours: @BP  (!) 147/94    Pulse (!) 57    Temp 98.5 F (36.9 C)    Resp 14    Ht 5\' 5"  (1.651 m)    Wt (!) 146.1 kg    BMI 53.58 kg/m  GEN: NAD EYE: Sclerae anicteric ENT: MMM CV: Non-tachycardic Pulm: CTA b/l GI: Soft, NT/ND NEURO:  Alert & Oriented x  3   , DO [facility]   12/03/2021 8:06 AM

## 2021-12-03 NOTE — Op Note (Signed)
[facility] Patient Name: Erin Henderson Procedure Date: 12/03/2021 MRN: [id number] Attending MD: Gerrit Heck , MD Date of Birth: 10/11/1976 CSN: [id number] Age: 45 Admit Type: Outpatient Procedure:                Colonoscopy Indications:              Abdominal pain in the right lower quadrant, Change                            in bowel habits, Diarrhea                           No previous colonoscopy Providers:                Gerrit Heck, MD, Michele Mcalpine, RN, Luan Moore, Technician, [place], CRNA, Referring MD:              Medicines:                Monitored Anesthesia Care Complications:            No immediate complications. Estimated Blood Loss:     Estimated blood loss was minimal. Procedure:                Pre-Anesthesia Assessment:                           - Prior to the procedure, a History and Physical                            was performed, and patient medications and                            allergies were reviewed. The patient's tolerance of                            previous anesthesia was also reviewed. The risks                            and benefits of the procedure and the sedation                            options and risks were discussed with the patient.                            All questions were answered, and informed consent                            was obtained. Prior Anticoagulants: The patient has                            taken no previous anticoagulant or antiplatelet                            agents. ASA Grade  Assessment: III - A patient with                            severe systemic disease. After reviewing the risks                            and benefits, the patient was deemed in                            satisfactory condition to undergo the procedure.                           After obtaining informed consent, the colonoscope                            was passed under  direct vision. Throughout the                            procedure, the patient's blood pressure, pulse, and                            oxygen saturations were monitored continuously. The                            CF-HQ190L ([id number]) Olympus colonoscope was                            introduced through the anus and advanced to the the                            cecum, identified by appendiceal orifice and                            ileocecal valve. The colonoscopy was performed                            without difficulty. The patient tolerated the                            procedure well. The quality of the bowel                            preparation was good following copious irrigation                            and lavege. The ileocecal valve, appendiceal                            orifice, and rectum were photographed. Scope In: 8:43:55 AM Scope Out: 9:04:11 AM Scope Withdrawal Time: 0 hours 15 minutes 41 seconds  Total Procedure Duration: 0 hours 20 minutes 16 seconds  Findings:      The perianal and digital rectal examinations were normal.      A few small-mouthed diverticula were found in the sigmoid colon.  Normal mucosa was found in the entire colon. Biopsies for histology were       taken with a cold forceps from the right colon and left colon for       evaluation of microscopic colitis. Estimated blood loss was minimal.      The retroflexed view of the distal rectum and anal verge was normal and       showed no anal or rectal abnormalities. Impression:               - Diverticulosis in the sigmoid colon.                           - Normal mucosa in the entire examined colon.                            Biopsied.                           - The distal rectum and anal verge are normal on                            retroflexion view. Moderate Sedation:      Not Applicable - Patient had care per Anesthesia. Recommendation:           - Patient has a contact number  available for                            emergencies. The signs and symptoms of potential                            delayed complications were discussed with the                            patient. Return to normal activities tomorrow.                            Written discharge instructions were provided to the                            patient.                           - Resume previous diet.                           - Continue present medications.                           - Await pathology results.                           - Repeat colonoscopy in 10 years for screening                            purposes.                           -  Return to GI office PRN.                           - Use fiber, for example Citrucel, Fibercon, Konsyl                            or Metamucil. Procedure Code(s):        --- Professional ---                           [phone], Colonoscopy, flexible; with biopsy, single                            or multiple Diagnosis Code(s):        --- Professional ---                           R10.31, Right lower quadrant pain                           R19.4, Change in bowel habit                           R19.7, Diarrhea, unspecified                           K57.30, Diverticulosis of large intestine without                            perforation or abscess without bleeding CPT copyright 2019 American Medical Association. All rights reserved. The codes documented in this report are preliminary and upon coder review may  be revised to meet current compliance requirements. Gerrit Heck, MD 12/03/2021 9:19:27 AM Number of Addenda: 0

## 2021-12-03 NOTE — Anesthesia Postprocedure Evaluation (Signed)
Anesthesia Post Note  Patient: Erin Henderson  Procedure(s) Performed: COLONOSCOPY WITH PROPOFOL ESOPHAGOGASTRODUODENOSCOPY (EGD) WITH PROPOFOL BIOPSY     Patient location during evaluation: Endoscopy Anesthesia Type: MAC Level of consciousness: awake and alert Pain management: pain level controlled Vital Signs Assessment: post-procedure vital signs reviewed and stable Respiratory status: spontaneous breathing, nonlabored ventilation and respiratory function stable Cardiovascular status: blood pressure returned to baseline and stable Postop Assessment: no apparent nausea or vomiting Anesthetic complications: no   No notable events documented.  Last Vitals:  Vitals:   12/03/21 0930 12/03/21 0932  BP:  (!) 147/91  Pulse: (!) 54 (!) 51  Resp: 16 17  Temp:    SpO2: 96% 98%    Last Pain:  Vitals:   12/03/21 0910  TempSrc: Oral  PainSc:                  Lidia Collum

## 2021-12-03 NOTE — Transfer of Care (Signed)
Immediate Anesthesia Transfer of Care Note  Patient: Erin Henderson  Procedure(s) Performed: COLONOSCOPY WITH PROPOFOL ESOPHAGOGASTRODUODENOSCOPY (EGD) WITH PROPOFOL BIOPSY  Patient Location: PACU  Anesthesia Type:MAC  Level of Consciousness: awake, alert  and oriented  Airway & Oxygen Therapy: Patient Spontanous Breathing  Post-op Assessment: Report given to RN and Post -op Vital signs reviewed and stable  Post vital signs: Reviewed and stable  Last Vitals:  Vitals Value Taken Time  BP 128/74 12/03/21 0910  Temp 36.4 C 12/03/21 0910  Pulse 63 12/03/21 0915  Resp 16 12/03/21 0915  SpO2 98 % 12/03/21 0915  Vitals shown include unvalidated device data.  Last Pain:  Vitals:   12/03/21 0910  TempSrc: Oral  PainSc:          Complications: No notable events documented.

## 2021-12-03 NOTE — Op Note (Signed)
[facility] Patient Name: Erin Henderson Procedure Date: 12/03/2021 MRN: [id number] Attending MD: Doristine Locks , MD Date of Birth: 1976-01-13 CSN: [id number] Age: 45 Admit Type: Outpatient Procedure:                Upper GI endoscopy Indications:              Heartburn, Esophageal reflux, Abdominal bloating,                            Diarrhea Providers:                Doristine Locks, MD, Elodia Florence, RN, Salley Scarlet, Technician, Deana Star, CRNA, Referring MD:              Medicines:                Monitored Anesthesia Care Complications:            No immediate complications. Estimated Blood Loss:     Estimated blood loss was minimal. Procedure:                Pre-Anesthesia Assessment:                           - Prior to the procedure, a History and Physical                            was performed, and patient medications and                            allergies were reviewed. The patient's tolerance of                            previous anesthesia was also reviewed. The risks                            and benefits of the procedure and the sedation                            options and risks were discussed with the patient.                            All questions were answered, and informed consent                            was obtained. Prior Anticoagulants: The patient has                            taken no previous anticoagulant or antiplatelet                            agents. ASA Grade Assessment: III - A patient with  severe systemic disease. After reviewing the risks                            and benefits, the patient was deemed in                            satisfactory condition to undergo the procedure.                           After obtaining informed consent, the endoscope was                            passed under direct vision. Throughout the                            procedure,  the patient's blood pressure, pulse, and                            oxygen saturations were monitored continuously. The                            [id number]-H190 ([id number]) Olympus endoscope was introduced                            through the mouth, and advanced to the second part                            of duodenum. The upper GI endoscopy was                            accomplished without difficulty. The patient                            tolerated the procedure well. Scope In: Scope Out: Findings:      The examined esophagus was normal.      The Z-line was regular and was found 40 cm from the incisors.      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 This was characterized by healthy appearing mucosa.      Localized mild inflammation characterized by erythema was found in the       gastric antrum. Biopsies were taken with a cold forceps for histology.       Estimated blood loss was minimal.      The examined duodenum was normal. Biopsies were taken with a cold       forceps for histology. Estimated blood loss was minimal. Impression:               - Normal esophagus.                           - Z-line regular, 40 cm from the incisors.                           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                           - Gastritis. Biopsied.                           - Normal examined duodenum. Biopsied. Moderate Sedation:      Not Applicable - Patient had care per Anesthesia. Recommendation:           - Patient has a contact number available for                            emergencies. The signs and symptoms of potential                            delayed complications were discussed with the                            patient. Return to normal activities tomorrow.                            Written discharge instructions were provided to the                             patient.                           - Resume previous diet.                           - Continue present medications.                           - Await pathology results.                           - Perform a colonoscopy today. Procedure Code(s):        --- Professional ---                           [phone], Esophagogastroduodenoscopy, flexible,                            transoral; with biopsy, single or multiple Diagnosis Code(s):        --- Professional ---                           X91.47, Bariatric surgery status                           K29.70, Gastritis, unspecified, without bleeding                           R12, Heartburn                           K21.9, Gastro-esophageal reflux disease without  esophagitis                           R19.7, Diarrhea, unspecified CPT copyright 2019 American Medical Association. All rights reserved. The codes documented in this report are preliminary and upon coder review may  be revised to meet current compliance requirements. Doristine Locks, MD 12/03/2021 9:16:21 AM Number of Addenda: 0

## 2021-12-04 ENCOUNTER — Encounter (HOSPITAL_COMMUNITY): Payer: Self-pay | Admitting: Gastroenterology

## 2021-12-04 ENCOUNTER — Encounter: Payer: Self-pay | Admitting: Gastroenterology

## 2021-12-04 LAB — SURGICAL PATHOLOGY

## 2021-12-15 NOTE — Progress Notes (Signed)
Diagnostic colonoscopy for change in bowel habits and abdominal pain

## 2021-12-26 ENCOUNTER — Other Ambulatory Visit: Payer: Self-pay | Admitting: Legal Medicine

## 2021-12-26 DIAGNOSIS — I10 Essential (primary) hypertension: Secondary | ICD-10-CM

## 2022-01-15 ENCOUNTER — Other Ambulatory Visit: Payer: Self-pay | Admitting: Legal Medicine

## 2022-01-15 DIAGNOSIS — I10 Essential (primary) hypertension: Secondary | ICD-10-CM

## 2022-02-02 IMAGING — MG MM DIGITAL SCREENING BILAT W/ TOMO AND CAD
6 of 9 series · 6 of 25 positions shown · non-contrast
Comparison: Previous exam(s).

ACR Breast Density Category a: The breast tissue is almost entirely
fatty.

CLINICAL DATA: Screening.

EXAM:
DIGITAL SCREENING BILATERAL MAMMOGRAM WITH TOMOSYNTHESIS AND CAD
TECHNIQUE: Bilateral screening digital craniocaudal and mediolateral oblique
mammograms were obtained. Bilateral screening digital breast
tomosynthesis was performed. The images were evaluated with
computer-aided detection.

[L MLO synth-2D]
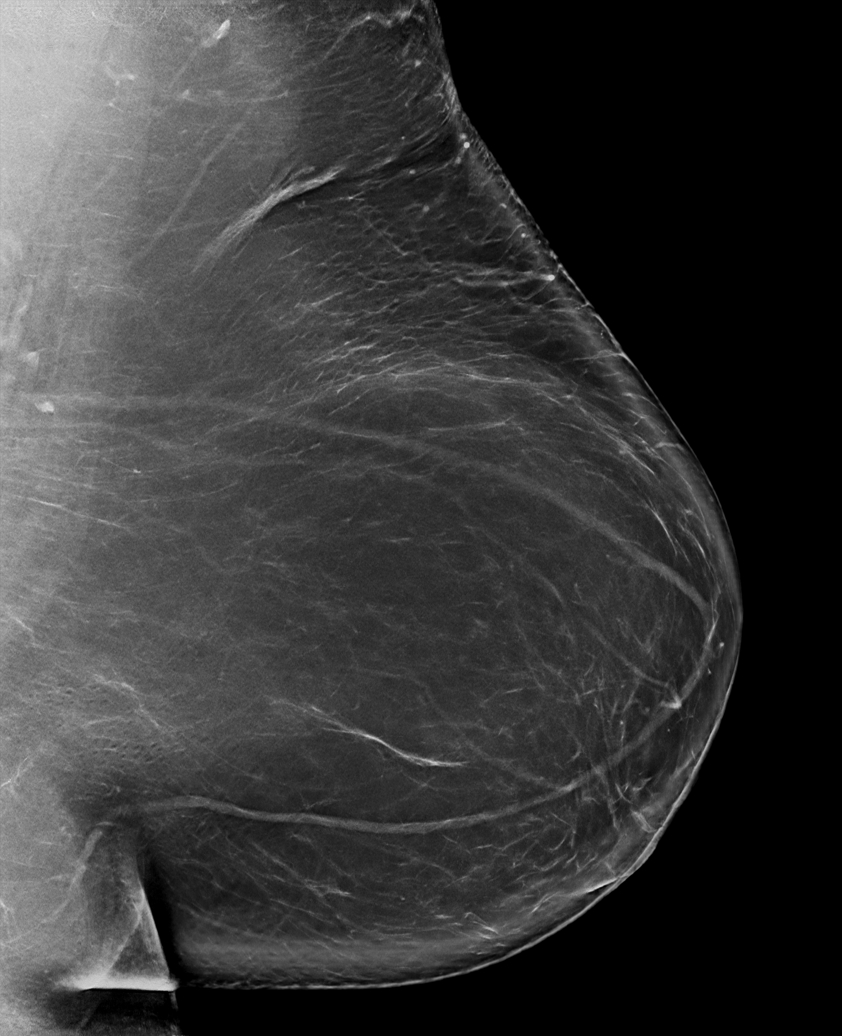

[R MLO synth-2D]
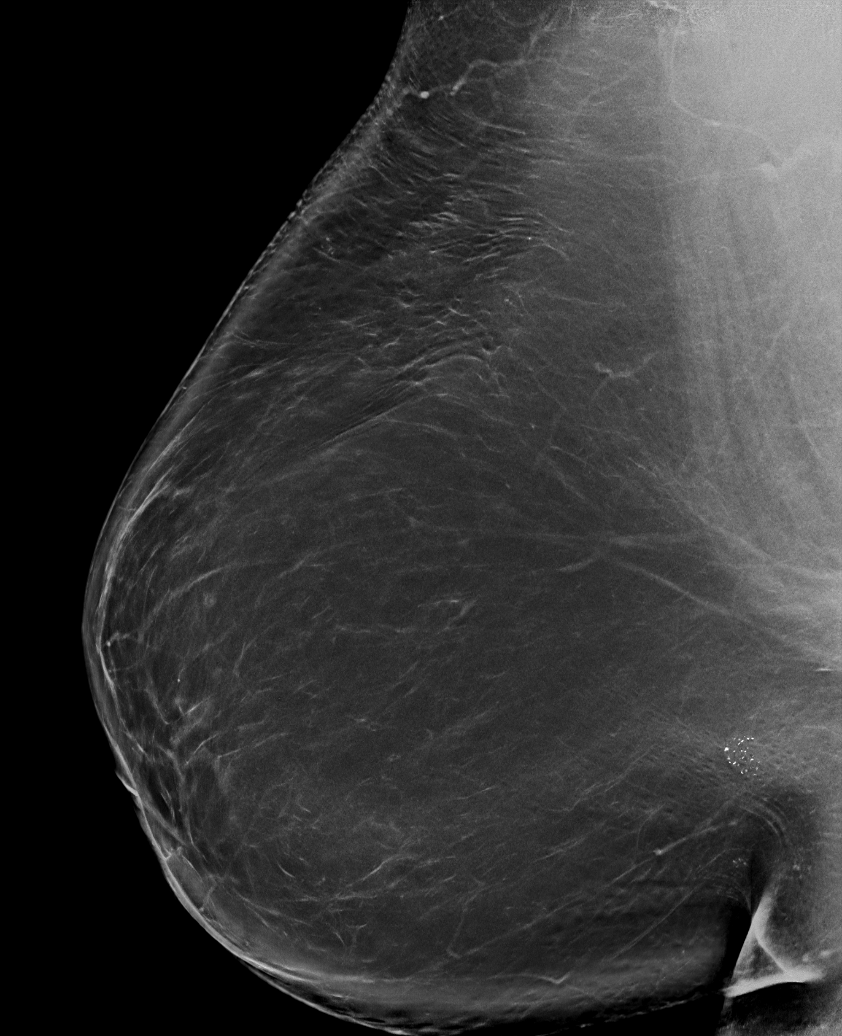

[R CV synth-2D]
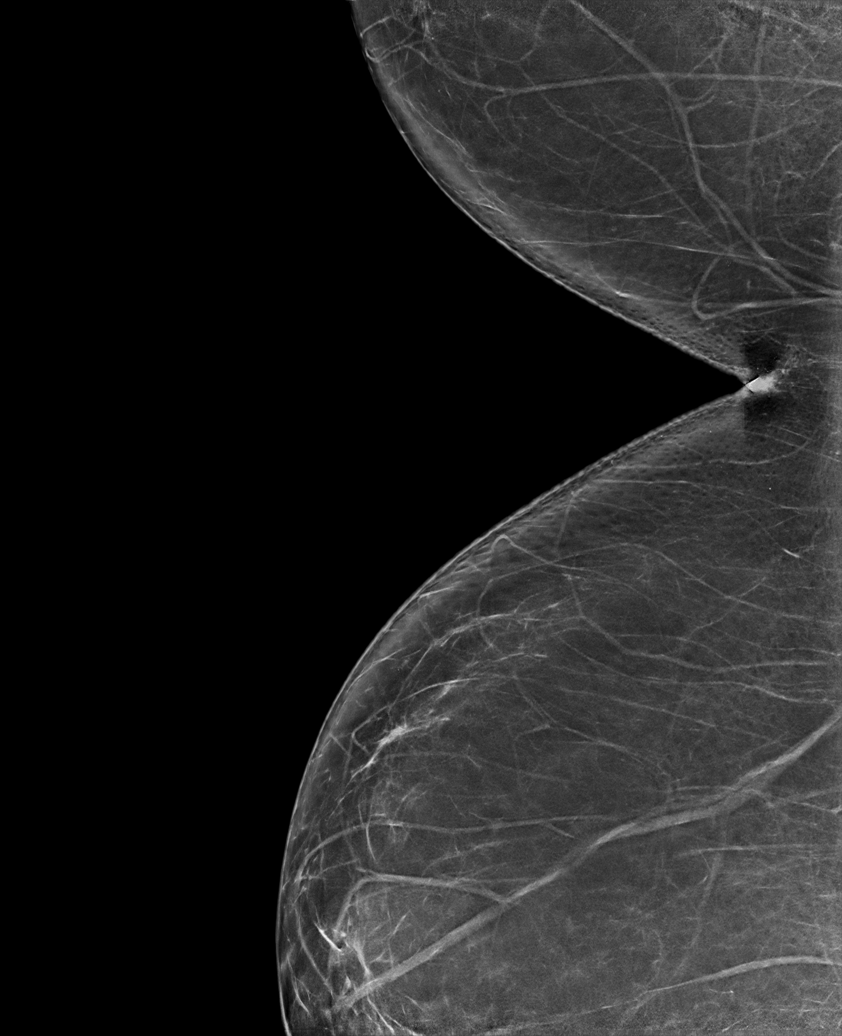

[R CC synth-2D]
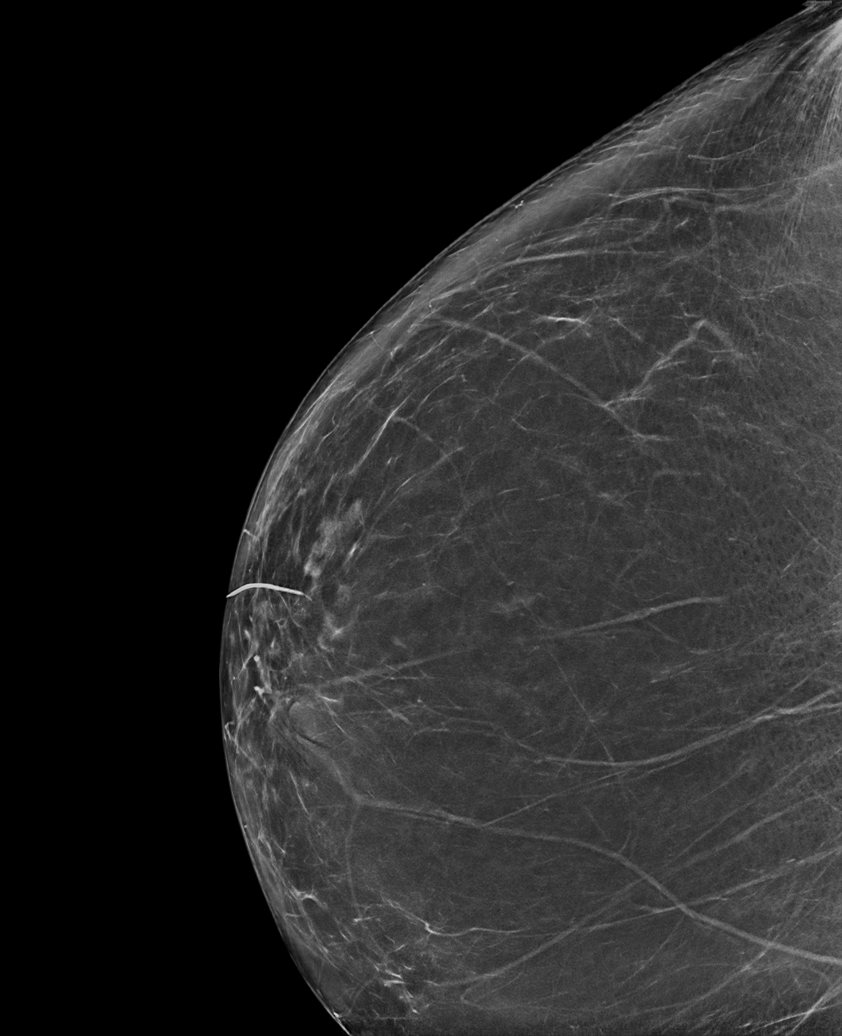

[L CC synth-2D]
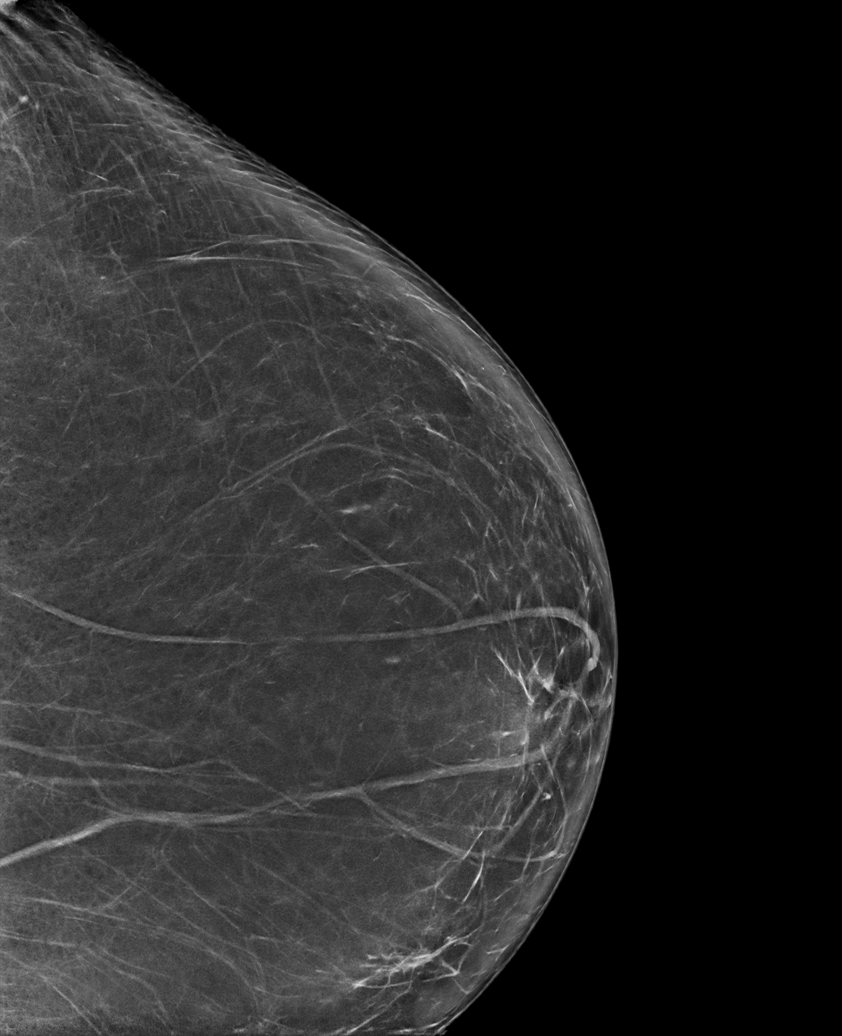

[L CC tomo · tomo slice 43/86.0]
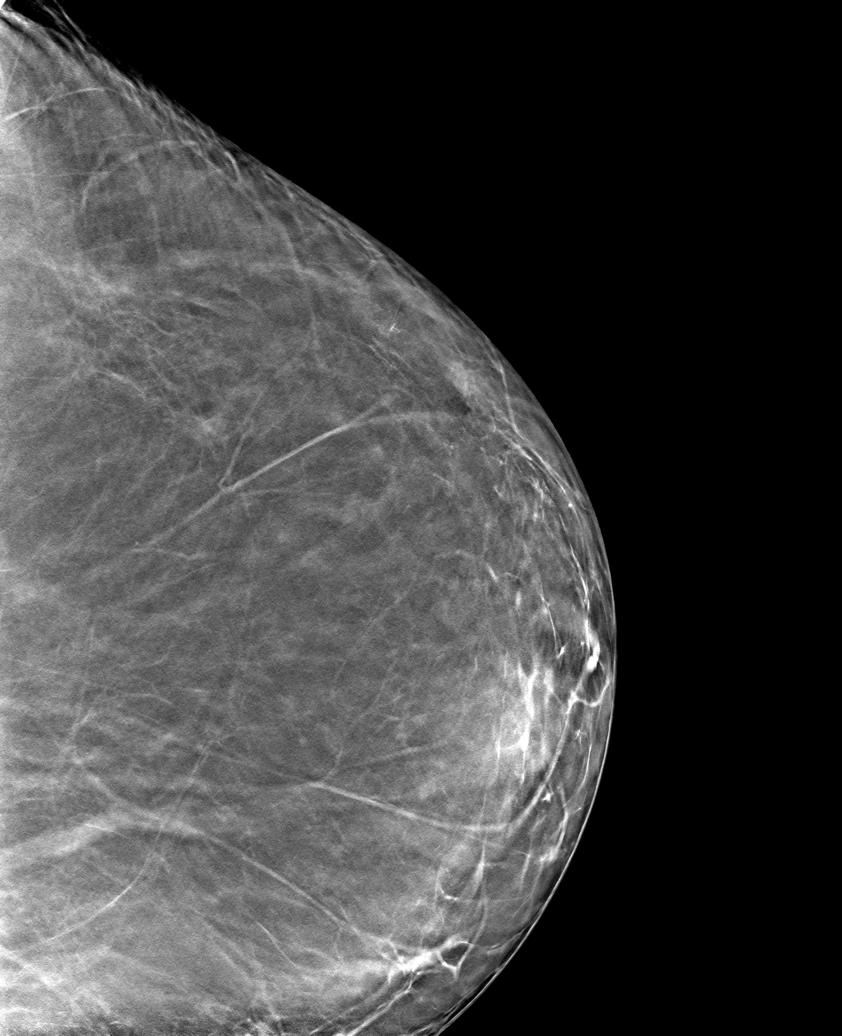

[6 of 25 positions shown; findings below may reference images not displayed]

FINDINGS: There are no findings suspicious for malignancy.
IMPRESSION: No mammographic evidence of malignancy. A result letter of this
screening mammogram will be mailed directly to the patient.

RECOMMENDATION:
Screening mammogram in one year. (Code:0E-3-N98)

BI-RADS CATEGORY  1: Negative.

## 2022-02-25 ENCOUNTER — Other Ambulatory Visit: Payer: Self-pay | Admitting: Legal Medicine

## 2022-02-25 NOTE — Telephone Encounter (Signed)
Refill sent to pharmacy.   

## 2022-03-09 NOTE — Progress Notes (Deleted)
? ?Subjective:  ?Patient ID: Erin Henderson, female    DOB: 25-May-1976  Age: 46 y.o. MRN: [id number] ? ?No chief complaint on file. ? ? ?HPI ?Hypothyroidism: She is taking levothyroxine 100 mcg daily. ? ?Hypertension: Patient is on Losartan 100 mg daily, propranolol 80 mg daily. ? ?Hyperlipidemia: She takes Pravastatin 20 mg daily. ?  ?Current Outpatient Medications on File Prior to Visit  ?Medication Sig Dispense Refill  ? albuterol (VENTOLIN HFA) 108 (90 Base) MCG/ACT inhaler Inhale 2 puffs into the lungs every 6 (six) hours as needed for wheezing or shortness of breath. 8 g 0  ? ARIPiprazole (ABILIFY) 10 MG tablet Take 10 mg by mouth daily.    ? cyclobenzaprine (FLEXERIL) 10 MG tablet Take 1 tablet (10 mg total) by mouth 3 (three) times daily as needed for muscle spasms. (Patient not taking: Reported on 11/28/2021) 30 tablet 0  ? fluticasone (FLONASE) 50 MCG/ACT nasal spray Place 2 sprays into both nostrils daily. (Patient taking differently: Place 1 spray into both nostrils 2 (two) times daily.) 16 g 6  ? ketorolac (TORADOL) 10 MG tablet Take 10 mg by mouth daily as needed (migraines).    ? L-Lysine 1000 MG TABS Take 1,000 mg by mouth daily. Once daily    ? levothyroxine (SYNTHROID) 100 MCG tablet TAKE 1 TABLET BY MOUTH EVERY MORNING ON AN EMPTY STOMACH. 30 tablet 6  ? loratadine (CLARITIN) 10 MG tablet Take 10 mg by mouth daily.    ? losartan (COZAAR) 100 MG tablet TAKE ONE TABLET BY MOUTH EVERY DAY 90 tablet 2  ? omeprazole (PRILOSEC) 40 MG capsule TAKE 1 CAPSULE BY MOUTH ONCE DAILY. 30 capsule 6  ? Oxcarbazepine (TRILEPTAL) 300 MG tablet Take 300 mg by mouth 2 (two) times daily.    ? pravastatin (PRAVACHOL) 20 MG tablet TAKE ONE TABLET BY MOUTH EVERY DAY 90 tablet 2  ? promethazine-dextromethorphan (PROMETHAZINE-DM) 6.25-15 MG/5ML syrup Take 5 mLs by mouth 4 (four) times daily as needed. (Patient not taking: Reported on 11/28/2021) 118 mL 0  ? propranolol (INDERAL) 80 MG tablet TAKE 1 TABLET BY MOUTH ONCE  DAILY. 30 tablet 6  ? rizatriptan (MAXALT) 10 MG tablet TAKE ONE TABLET BY MOUTH AT ONSET OF HEADACHE. MAY REPEAT IN TWO HOURS IF NEEDED. 30 tablet 6  ? sertraline (ZOLOFT) 100 MG tablet Take 100 mg by mouth 2 (two) times daily.    ? ?No current facility-administered medications on file prior to visit.  ? ?Past Medical History:  ?Diagnosis Date  ? Anxiety   ? Depression   ? GERD with esophagitis   ? Hyperlipidemia   ? Hypertension   ? Hypothyroidism   ? ?Past Surgical History:  ?Procedure Laterality Date  ? BIOPSY  12/03/2021  ? Procedure: BIOPSY;  Surgeon: Shellia Cleverly, DO;  Location: [facility];  Service: Gastroenterology;;  EGD and COLON  ? BREAST EXCISIONAL BIOPSY Right   ? years ago- benign ?in [place]  ? CHOLECYSTECTOMY    ? COLONOSCOPY WITH PROPOFOL N/A 12/03/2021  ? Procedure: COLONOSCOPY WITH PROPOFOL;  Surgeon: Shellia Cleverly, DO;  Location: [facility];  Service: Gastroenterology;  Laterality: N/A;  ? ESOPHAGOGASTRODUODENOSCOPY (EGD) WITH PROPOFOL N/A 12/03/2021  ? Procedure: ESOPHAGOGASTRODUODENOSCOPY (EGD) WITH PROPOFOL;  Surgeon: Shellia Cleverly, DO;  Location: [facility];  Service: Gastroenterology;  Laterality: N/A;  ? LAPAROSCOPIC GASTRIC SLEEVE RESECTION    ? VAGINAL HYSTERECTOMY    ?  ?Family History  ?Problem Relation Age of Onset  ? Hypertension Mother   ?  Hyperlipidemia Mother   ? Thyroid disease Mother   ? Migraines Mother   ? Asthma Father   ? Stroke Father   ? Diabetes Father   ? Hyperlipidemia Father   ? Hypertension Father   ? Thyroid disease Father   ? Liver cancer Father   ? Hyperlipidemia Sister   ? Hypertension Sister   ? Breast cancer Maternal Aunt   ? Breast cancer Maternal Aunt   ? Breast cancer Maternal Grandmother   ? Colon cancer Neg Hx   ? Rectal cancer Neg Hx   ? ?Social History  ? ?Socioeconomic History  ? Marital status: Married  ?  Spouse name: Not on file  ? Number of children: 2  ? Years of education: Not on file  ? Highest education level: Not on file   ?Occupational History  ? Occupation: school bus driver  ?Tobacco Use  ? Smoking status: Former  ?  Packs/day: 1.00  ?  Types: Cigarettes  ?  Quit date: 2000  ?  Years since quitting: 23.2  ? Smokeless tobacco: Never  ?Vaping Use  ? Vaping Use: Never used  ?Substance and Sexual Activity  ? Alcohol use: Never  ? Drug use: Never  ? Sexual activity: Not on file  ?Other Topics Concern  ? Not on file  ?Social History Narrative  ? Not on file  ? ?Social Determinants of Health  ? ?Financial Resource Strain: Not on file  ?Food Insecurity: Not on file  ?Transportation Needs: Not on file  ?Physical Activity: Not on file  ?Stress: Not on file  ?Social Connections: Not on file  ? ? ?Review of Systems ? ? ?Objective:  ?There were no vitals taken for this visit. ? ?BP/Weight 12/03/2021 10/07/2021 10/01/2021  ?Systolic BP 147 124 126  ?Diastolic BP 91 78 80  ?Wt. (Lbs) 322 337 324  ?BMI 53.58 56.08 53.92  ? ? ?Physical Exam ? ?Diabetic Foot Exam - Simple   ?No data filed ?  ?  ? ?Lab Results  ?Component Value Date  ? WBC 9.0 09/11/2021  ? HGB 12.0 09/11/2021  ? HCT 37.5 09/11/2021  ? PLT 224 09/11/2021  ? GLUCOSE 90 09/11/2021  ? CHOL 162 09/11/2021  ? TRIG 202 (H) 09/11/2021  ? HDL 30 (L) 09/11/2021  ? LDLCALC 97 09/11/2021  ? ALT 31 09/11/2021  ? AST 19 09/11/2021  ? NA 143 09/11/2021  ? K 4.4 09/11/2021  ? CL 105 09/11/2021  ? CREATININE 0.79 09/11/2021  ? BUN 16 09/11/2021  ? CO2 23 09/11/2021  ? TSH 0.966 09/11/2021  ? HGBA1C 5.1 03/11/2021  ? ? ? ? ?Assessment & Plan:  ? ?Problem List Items Addressed This Visit   ? ?  ? Cardiovascular and Mediastinum  ? Essential hypertension - Primary  ?  ? Respiratory  ? 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  ? Digestive  ? GERD with esophagitis  ?  ? Endocrine  ? Hypothyroidism  ?  ? Other  ? Mixed hyperlipidemia  ?. ? ?No orders of the defined types were placed in this encounter. ? ? ?No orders of the defined types were placed in this encounter. ?  ? ?Follow-up: No follow-ups on  file. ? ?An After Visit Summary was printed and given to the patient. ? ?Brent Bulla, MD ?[facility] ?[phone] ?

## 2022-03-11 ENCOUNTER — Ambulatory Visit: Payer: BC Managed Care – PPO | Admitting: Legal Medicine

## 2022-03-11 DIAGNOSIS — E782 Mixed hyperlipidemia: Secondary | ICD-10-CM

## 2022-03-11 DIAGNOSIS — E038 Other specified hypothyroidism: Secondary | ICD-10-CM

## 2022-03-11 DIAGNOSIS — I10 Essential (primary) hypertension: Secondary | ICD-10-CM

## 2022-03-11 DIAGNOSIS — K2100 Gastro-esophageal reflux disease with esophagitis, without bleeding: Secondary | ICD-10-CM

## 2022-03-11 DIAGNOSIS — J4540 Moderate persistent asthma, uncomplicated: Secondary | ICD-10-CM

## 2022-06-24 ENCOUNTER — Other Ambulatory Visit: Payer: Self-pay | Admitting: Legal Medicine

## 2022-09-23 ENCOUNTER — Other Ambulatory Visit: Payer: Self-pay | Admitting: Legal Medicine

## 2022-09-23 DIAGNOSIS — I10 Essential (primary) hypertension: Secondary | ICD-10-CM

## 2022-10-02 ENCOUNTER — Other Ambulatory Visit: Payer: Self-pay | Admitting: Legal Medicine

## 2022-10-02 DIAGNOSIS — I10 Essential (primary) hypertension: Secondary | ICD-10-CM

## 2022-10-21 ENCOUNTER — Other Ambulatory Visit: Payer: Self-pay | Admitting: Radiology

## 2022-10-21 ENCOUNTER — Other Ambulatory Visit: Payer: Self-pay | Admitting: Family Medicine

## 2022-10-21 DIAGNOSIS — Z1231 Encounter for screening mammogram for malignant neoplasm of breast: Secondary | ICD-10-CM

## 2022-10-30 ENCOUNTER — Ambulatory Visit
Admission: RE | Admit: 2022-10-30 | Discharge: 2022-10-30 | Disposition: A | Discharge status: Home / Self Care | Payer: BC Managed Care – PPO | Source: Ambulatory Visit | Attending: Family Medicine | Admitting: Family Medicine

## 2022-10-30 DIAGNOSIS — Z1231 Encounter for screening mammogram for malignant neoplasm of breast: Secondary | ICD-10-CM

## 2023-02-08 ENCOUNTER — Telehealth: Payer: Self-pay

## 2023-02-08 NOTE — Telephone Encounter (Signed)
I called the patient today to see about getting an appointment scheduled for a chronic f.up fasting. Looks like this patient is currently overdue for an appointment. I left a message for the patient to call the office back.

## 2023-09-24 ENCOUNTER — Other Ambulatory Visit: Payer: Self-pay | Admitting: Family Medicine

## 2023-09-24 DIAGNOSIS — Z1231 Encounter for screening mammogram for malignant neoplasm of breast: Secondary | ICD-10-CM

## 2023-11-03 ENCOUNTER — Ambulatory Visit
Admission: RE | Admit: 2023-11-03 | Discharge: 2023-11-03 | Disposition: A | Discharge status: Home / Self Care | Payer: 59 | Source: Ambulatory Visit | Attending: Family Medicine | Admitting: Family Medicine

## 2023-11-03 DIAGNOSIS — Z1231 Encounter for screening mammogram for malignant neoplasm of breast: Secondary | ICD-10-CM

## 2024-03-20 DIAGNOSIS — J45909 Unspecified asthma, uncomplicated: Secondary | ICD-10-CM | POA: Insufficient documentation

## 2024-08-14 ENCOUNTER — Other Ambulatory Visit: Payer: Self-pay | Admitting: Family Medicine

## 2024-08-14 DIAGNOSIS — Z1231 Encounter for screening mammogram for malignant neoplasm of breast: Secondary | ICD-10-CM

## 2024-11-14 ENCOUNTER — Encounter

## 2024-12-18 ENCOUNTER — Ambulatory Visit
Admission: RE | Admit: 2024-12-18 | Discharge: 2024-12-18 | Disposition: A | Source: Ambulatory Visit | Attending: Family Medicine | Admitting: Family Medicine

## 2024-12-18 DIAGNOSIS — Z1231 Encounter for screening mammogram for malignant neoplasm of breast: Secondary | ICD-10-CM

## 2024-12-25 ENCOUNTER — Ambulatory Visit (INDEPENDENT_AMBULATORY_CARE_PROVIDER_SITE_OTHER): Admitting: Podiatry

## 2024-12-25 DIAGNOSIS — L600 Ingrowing nail: Secondary | ICD-10-CM | POA: Diagnosis not present

## 2024-12-25 NOTE — Progress Notes (Signed)
" °   °  °  Subjective:  Patient ID: Erin Henderson, female    DOB: 11-06-1976,  MRN: [id number]  Erin Henderson presents to clinic today for:  Chief Complaint  Patient presents with   Ingrown Toenail    Right hallux, bilateral nail borders, the lateral border has been treated before.   Does not look infected. Not diabetic no anti coag.     Patient presents today with the above concerns to the right great toenail borders.  Denies recent injury but does state that she thinks that she had previous ingrown toenail procedures a few years ago to this toe  Allergies[1]  Objective:  Erin Henderson is a pleasant 49 y.o. female in NAD. AAO x 3.  Vascular Examination: Capillary refill time is 3-5 seconds to toes bilateral. Palpable pedal pulses b/l LE. Digital hair present b/l. No pedal edema b/l. Skin temperature gradient WNL b/l. No varicosities b/l. No cyanosis or clubbing noted b/l.   Dermatological Examination: There is incurvation of the right hallux medial and lateral nail border.  There is pain on palpation of the affected nail border.  There appears to be nail regrowth that is obvious on the medial border.  Along the lateral border it seems that the nail has curved more.  No drainage noted  Neurological Examination: Epicritic sensation is intact to the toes.  Assessment/Plan: 1. Ingrown toenail    Discussed patient's condition today.  After obtaining patient consent, the right hallux was anesthetized with a 50:50 mixture of 1% lidocaine  plain and 0.5% bupivacaine plain for a total of 3cc's administered.  Upon confirmation of anesthesia, a freer elevator was utilized to free the right hallux medial and lateral nail border from the nail bed.  The nail border was then avulsed proximal to the eponychium and removed in toto.  The area was inspected for any remaining spicules.  A chemical matrixectomy was performed with NaOH and neutralized with acetic acid solution.  Antibiotic ointment and  a DSD were applied, followed by a Coban dressing.  Patient tolerated the anesthetic and procedure well and will f/u in 2-3 weeks for recheck.  Patient given post-procedure instructions for daily 15-minute Epsom salt soaks, antibiotic ointment and daily use of Bandaids until toe starts to dry / form eschar.    Return in about 2 weeks (around 01/08/2025) for PNA recheck.   Erin Henderson, DPM, FACFAS [facility]     [address] [place] [address]                Office [phone]  Fax [phone]    [1]  Allergies Allergen Reactions   Cefdinir Hives, Nausea And Vomiting and Nausea Only   Risperidone Other (See Comments)    unknown  unknown, unknown  Other Reaction(s): Other (See Comments)    unknown unknown    unknown, unknown    unknown   "

## 2024-12-25 NOTE — Patient Instructions (Signed)

## 2025-01-08 ENCOUNTER — Ambulatory Visit: Admitting: Podiatry
